# Patient Record
Sex: Female | Born: 1975
Health system: Southern US, Community
[De-identification: ages and names within clinical notes are randomized; demographics above are authoritative.]

## PROBLEM LIST (undated history)

## (undated) DIAGNOSIS — K5900 Constipation, unspecified: Secondary | ICD-10-CM

## (undated) DIAGNOSIS — R921 Mammographic calcification found on diagnostic imaging of breast: Secondary | ICD-10-CM

## (undated) HISTORY — PX: NO PAST SURGERIES: SHX2092

## (undated) HISTORY — DX: Constipation, unspecified: K59.00

---

## 1999-01-22 ENCOUNTER — Other Ambulatory Visit: Admission: RE | Admit: 1999-01-22 | Discharge: 1999-01-22 | Payer: Self-pay | Admitting: Obstetrics and Gynecology

## 1999-10-11 ENCOUNTER — Emergency Department (HOSPITAL_COMMUNITY): Admission: EM | Admit: 1999-10-11 | Discharge: 1999-10-11 | Payer: Self-pay | Admitting: Emergency Medicine

## 1999-10-12 ENCOUNTER — Emergency Department (HOSPITAL_COMMUNITY): Admission: EM | Admit: 1999-10-12 | Discharge: 1999-10-12 | Payer: Self-pay | Admitting: Emergency Medicine

## 1999-11-15 ENCOUNTER — Other Ambulatory Visit: Admission: RE | Admit: 1999-11-15 | Discharge: 1999-11-15 | Payer: Self-pay | Admitting: Obstetrics and Gynecology

## 2010-06-16 ENCOUNTER — Encounter
Admission: RE | Admit: 2010-06-16 | Discharge: 2010-06-16 | Payer: Self-pay | Source: Home / Self Care | Attending: Obstetrics and Gynecology | Admitting: Obstetrics and Gynecology

## 2010-07-07 ENCOUNTER — Other Ambulatory Visit: Payer: Self-pay | Admitting: Obstetrics and Gynecology

## 2011-01-20 ENCOUNTER — Other Ambulatory Visit: Payer: Self-pay | Admitting: Obstetrics and Gynecology

## 2011-01-20 DIAGNOSIS — R921 Mammographic calcification found on diagnostic imaging of breast: Secondary | ICD-10-CM

## 2011-02-08 ENCOUNTER — Ambulatory Visit
Admission: RE | Admit: 2011-02-08 | Discharge: 2011-02-08 | Disposition: A | Discharge status: Home / Self Care | Payer: BC Managed Care – PPO | Source: Ambulatory Visit | Attending: Obstetrics and Gynecology | Admitting: Obstetrics and Gynecology

## 2011-02-08 DIAGNOSIS — R921 Mammographic calcification found on diagnostic imaging of breast: Secondary | ICD-10-CM

## 2011-02-08 LAB — HM MAMMOGRAPHY: HM Mammogram: NORMAL

## 2011-08-11 ENCOUNTER — Ambulatory Visit: Payer: BC Managed Care – PPO | Admitting: Internal Medicine

## 2011-08-12 ENCOUNTER — Ambulatory Visit (INDEPENDENT_AMBULATORY_CARE_PROVIDER_SITE_OTHER): Payer: BC Managed Care – PPO | Admitting: Internal Medicine

## 2011-08-12 ENCOUNTER — Other Ambulatory Visit (INDEPENDENT_AMBULATORY_CARE_PROVIDER_SITE_OTHER): Payer: BC Managed Care – PPO

## 2011-08-12 ENCOUNTER — Encounter: Payer: Self-pay | Admitting: Internal Medicine

## 2011-08-12 VITALS — BP 132/80 | HR 81 | Temp 98.7°F | Resp 16 | Ht 64.0 in | Wt 150.0 lb

## 2011-08-12 DIAGNOSIS — R0609 Other forms of dyspnea: Secondary | ICD-10-CM

## 2011-08-12 DIAGNOSIS — Z0000 Encounter for general adult medical examination without abnormal findings: Secondary | ICD-10-CM | POA: Insufficient documentation

## 2011-08-12 DIAGNOSIS — Z23 Encounter for immunization: Secondary | ICD-10-CM

## 2011-08-12 DIAGNOSIS — Z720 Tobacco use: Secondary | ICD-10-CM | POA: Insufficient documentation

## 2011-08-12 DIAGNOSIS — F17200 Nicotine dependence, unspecified, uncomplicated: Secondary | ICD-10-CM

## 2011-08-12 DIAGNOSIS — R0683 Snoring: Secondary | ICD-10-CM | POA: Insufficient documentation

## 2011-08-12 LAB — COMPREHENSIVE METABOLIC PANEL
AST: 20 U/L (ref 0–37)
Albumin: 3.4 g/dL — ABNORMAL LOW (ref 3.5–5.2)
BUN: 13 mg/dL (ref 6–23)
Calcium: 9.2 mg/dL (ref 8.4–10.5)
Chloride: 107 mEq/L (ref 96–112)
Glucose, Bld: 86 mg/dL (ref 70–99)
Potassium: 5.3 mEq/L — ABNORMAL HIGH (ref 3.5–5.1)

## 2011-08-12 LAB — CBC WITH DIFFERENTIAL/PLATELET
Basophils Absolute: 0.1 10*3/uL (ref 0.0–0.1)
Hemoglobin: 13.1 g/dL (ref 12.0–15.0)
Lymphocytes Relative: 29 % (ref 12.0–46.0)
Monocytes Relative: 9.6 % (ref 3.0–12.0)
Platelets: 313 10*3/uL (ref 150.0–400.0)
RDW: 14.8 % — ABNORMAL HIGH (ref 11.5–14.6)

## 2011-08-12 LAB — URINALYSIS, ROUTINE W REFLEX MICROSCOPIC
Leukocytes, UA: NEGATIVE
Nitrite: NEGATIVE
Specific Gravity, Urine: 1.02 (ref 1.000–1.030)
pH: 7 (ref 5.0–8.0)

## 2011-08-12 LAB — LDL CHOLESTEROL, DIRECT: Direct LDL: 88.6 mg/dL

## 2011-08-12 LAB — TSH: TSH: 0.42 u[IU]/mL (ref 0.35–5.50)

## 2011-08-12 NOTE — Patient Instructions (Signed)
Smoking Cessation This document explains the best ways for you to quit smoking and new treatments to help. It lists new medicines that can double or triple your chances of quitting and quitting for good. It also considers ways to avoid relapses and concerns you may have about quitting, including weight gain. NICOTINE: A POWERFUL ADDICTION If you have tried to quit smoking, you know how hard it can be. It is hard because nicotine is a very addictive drug. For some people, it can be as addictive as heroin or cocaine. Usually, people make 2 or 3 tries, or more, before finally being able to quit. Each time you try to quit, you can learn about what helps and what hurts. Quitting takes hard work and a lot of effort, but you can quit smoking. QUITTING SMOKING IS ONE OF THE MOST IMPORTANT THINGS YOU WILL EVER DO.  You will live longer, feel better, and live better.   The impact on your body of quitting smoking is felt almost immediately:   Within 20 minutes, blood pressure decreases. Pulse returns to its normal level.   After 8 hours, carbon monoxide levels in the blood return to normal. Oxygen level increases.   After 24 hours, chance of heart attack starts to decrease. Breath, hair, and body stop smelling like smoke.   After 48 hours, damaged nerve endings begin to recover. Sense of taste and smell improve.   After 72 hours, the body is virtually free of nicotine. Bronchial tubes relax and breathing becomes easier.   After 2 to 12 weeks, lungs can hold more air. Exercise becomes easier and circulation improves.   Quitting will reduce your risk of having a heart attack, stroke, cancer, or lung disease:   After 1 year, the risk of coronary heart disease is cut in half.   After 5 years, the risk of stroke falls to the same as a nonsmoker.   After 10 years, the risk of lung cancer is cut in half and the risk of other cancers decreases significantly.   After 15 years, the risk of coronary heart  disease drops, usually to the level of a nonsmoker.   If you are pregnant, quitting smoking will improve your chances of having a healthy baby.   The people you live with, especially your children, will be healthier.   You will have extra money to spend on things other than cigarettes.  FIVE KEYS TO QUITTING Studies have shown that these 5 steps will help you quit smoking and quit for good. You have the best chances of quitting if you use them together: 1. Get ready.  2. Get support and encouragement.  3. Learn new skills and behaviors.  4. Get medicine to reduce your nicotine addiction and use it correctly.  5. Be prepared for relapse or difficult situations. Be determined to continue trying to quit, even if you do not succeed at first.  1. GET READY  Set a quit date.   Change your environment.   Get rid of ALL cigarettes, ashtrays, matches, and lighters in your home, car, and place of work.   Do not let people smoke in your home.   Review your past attempts to quit. Think about what worked and what did not.   Once you quit, do not smoke. NOT EVEN A PUFF!  2. GET SUPPORT AND ENCOURAGEMENT Studies have shown that you have a better chance of being successful if you have help. You can get support in many ways.  Tell   your family, friends, and coworkers that you are going to quit and need their support. Ask them not to smoke around you.   Talk to your caregivers (doctor, dentist, nurse, pharmacist, psychologist, and/or smoking counselor).   Get individual, group, or telephone counseling and support. The more counseling you have, the better your chances are of quitting. Programs are available at local hospitals and health centers. Call your local health department for information about programs in your area.   Spiritual beliefs and practices may help some smokers quit.   Quit meters are small computer programs online or downloadable that keep track of quit statistics, such as amount  of "quit-time," cigarettes not smoked, and money saved.   Many smokers find one or more of the many self-help books available useful in helping them quit and stay off tobacco.  3. LEARN NEW SKILLS AND BEHAVIORS  Try to distract yourself from urges to smoke. Talk to someone, go for a walk, or occupy your time with a task.   When you first try to quit, change your routine. Take a different route to work. Drink tea instead of coffee. Eat breakfast in a different place.   Do something to reduce your stress. Take a hot bath, exercise, or read a book.   Plan something enjoyable to do every day. Reward yourself for not smoking.   Explore interactive web-based programs that specialize in helping you quit.  4. GET MEDICINE AND USE IT CORRECTLY Medicines can help you stop smoking and decrease the urge to smoke. Combining medicine with the above behavioral methods and support can quadruple your chances of successfully quitting smoking. The [place] Food and Drug Administration (FDA) has approved 7 medicines to help you quit smoking. These medicines fall into 3 categories.  Nicotine replacement therapy (delivers nicotine to your body without the negative effects and risks of smoking):   Nicotine gum: Available over-the-counter.   Nicotine lozenges: Available over-the-counter.   Nicotine inhaler: Available by prescription.   Nicotine nasal spray: Available by prescription.   Nicotine skin patches (transdermal): Available by prescription and over-the-counter.   Antidepressant medicine (helps people abstain from smoking, but how this works is unknown):   Bupropion sustained-release (SR) tablets: Available by prescription.   Nicotinic receptor partial agonist (simulates the effect of nicotine in your brain):   Varenicline tartrate tablets: Available by prescription.   Ask your caregiver for advice about which medicines to use and how to use them. Carefully read the information on the package.    Everyone who is trying to quit may benefit from using a medicine. If you are pregnant or trying to become pregnant, nursing an infant, you are under age 18, or you smoke fewer than 10 cigarettes per day, talk to your caregiver before taking any nicotine replacement medicines.   You should stop using a nicotine replacement product and call your caregiver if you experience nausea, dizziness, weakness, vomiting, fast or irregular heartbeat, mouth problems with the lozenge or gum, or redness or swelling of the skin around the patch that does not go away.   Do not use any other product containing nicotine while using a nicotine replacement product.   Talk to your caregiver before using these products if you have diabetes, heart disease, asthma, stomach ulcers, you had a recent heart attack, you have high blood pressure that is not controlled with medicine, a history of irregular heartbeat, or you have been prescribed medicine to help you quit smoking.  5. BE PREPARED FOR RELAPSE OR   DIFFICULT SITUATIONS  Most relapses occur within the first 3 months after quitting. Do not be discouraged if you start smoking again. Remember, most people try several times before they finally quit.   You may have symptoms of withdrawal because your body is used to nicotine. You may crave cigarettes, be irritable, feel very hungry, cough often, get headaches, or have difficulty concentrating.   The withdrawal symptoms are only temporary. They are strongest when you first quit, but they will go away within 10 to 14 days.  Here are some difficult situations to watch for:  Alcohol. Avoid drinking alcohol. Drinking lowers your chances of successfully quitting.   Caffeine. Try to reduce the amount of caffeine you consume. It also lowers your chances of successfully quitting.   Other smokers. Being around smoking can make you want to smoke. Avoid smokers.   Weight gain. Many smokers will gain weight when they quit, usually  less than 10 pounds. Eat a healthy diet and stay active. Do not let weight gain distract you from your main goal, quitting smoking. Some medicines that help you quit smoking may also help delay weight gain. You can always lose the weight gained after you quit.   Bad mood or depression. There are a lot of ways to improve your mood other than smoking.  If you are having problems with any of these situations, talk to your caregiver. SPECIAL SITUATIONS AND CONDITIONS Studies suggest that everyone can quit smoking. Your situation or condition can give you a special reason to quit.  Pregnant women/new mothers: By quitting, you protect your baby's health and your own.   Hospitalized patients: By quitting, you reduce health problems and help healing.   Heart attack patients: By quitting, you reduce your risk of a second heart attack.   Lung, head, and neck cancer patients: By quitting, you reduce your chance of a second cancer.   Parents of children and adolescents: By quitting, you protect your children from illnesses caused by secondhand smoke.  QUESTIONS TO THINK ABOUT Think about the following questions before you try to stop smoking. You may want to talk about your answers with your caregiver.  Why do you want to quit?   If you tried to quit in the past, what helped and what did not?   What will be the most difficult situations for you after you quit? How will you plan to handle them?   Who can help you through the tough times? Your family? Friends? Caregiver?   What pleasures do you get from smoking? What ways can you still get pleasure if you quit?  Here are some questions to ask your caregiver:  How can you help me to be successful at quitting?   What medicine do you think would be best for me and how should I take it?   What should I do if I need more help?   What is smoking withdrawal like? How can I get information on withdrawal?  Quitting takes hard work and a lot of effort,  but you can quit smoking. FOR MORE INFORMATION  Smokefree.gov (http://www.davis-sullivan.com/) provides free, accurate, evidence-based information and professional assistance to help support the immediate and long-term needs of people trying to quit smoking. Document Released: 05/10/2001 Document Revised: 05/05/2011 Document Reviewed: 03/02/2009 [facility] Patient Information 2012 [place], [place].Preventive Care for Adults, Female A healthy lifestyle and preventive care can promote health and wellness. Preventive health guidelines for women include the following key practices.  A routine yearly physical is  a good way to check with your caregiver about your health and preventive screening. It is a chance to share any concerns and updates on your health, and to receive a thorough exam.   Visit your dentist for a routine exam and preventive care every 6 months. Brush your teeth twice a day and floss once a day. Good oral hygiene prevents tooth decay and gum disease.   The frequency of eye exams is based on your age, health, family medical history, use of contact lenses, and other factors. Follow your caregiver's recommendations for frequency of eye exams.   Eat a healthy diet. Foods like vegetables, fruits, whole grains, low-fat dairy products, and lean protein foods contain the nutrients you need without too many calories. Decrease your intake of foods high in solid fats, added sugars, and salt. Eat the right amount of calories for you.Get information about a proper diet from your caregiver, if necessary.   Regular physical exercise is one of the most important things you can do for your health. Most adults should get at least 150 minutes of moderate-intensity exercise (any activity that increases your heart rate and causes you to sweat) each week. In addition, most adults need muscle-strengthening exercises on 2 or more days a week.   Maintain a healthy weight. The body mass index (BMI) is a screening tool  to identify possible weight problems. It provides an estimate of body fat based on height and weight. Your caregiver can help determine your BMI, and can help you achieve or maintain a healthy weight.For adults 20 years and older:   A BMI below 18.5 is considered underweight.   A BMI of 18.5 to 24.9 is normal.   A BMI of 25 to 29.9 is considered overweight.   A BMI of 30 and above is considered obese.   Maintain normal blood lipids and cholesterol levels by exercising and minimizing your intake of saturated fat. Eat a balanced diet with plenty of fruit and vegetables. Blood tests for lipids and cholesterol should begin at age 26 and be repeated every 5 years. If your lipid or cholesterol levels are high, you are over 50, or you are at high risk for heart disease, you may need your cholesterol levels checked more frequently.Ongoing high lipid and cholesterol levels should be treated with medicines if diet and exercise are not effective.   If you smoke, find out from your caregiver how to quit. If you do not use tobacco, do not start.   If you are pregnant, do not drink alcohol. If you are breastfeeding, be very cautious about drinking alcohol. If you are not pregnant and choose to drink alcohol, do not exceed 1 drink per day. One drink is considered to be 12 ounces (355 mL) of beer, 5 ounces (148 mL) of wine, or 1.5 ounces (44 mL) of liquor.   Avoid use of street drugs. Do not share needles with anyone. Ask for help if you need support or instructions about stopping the use of drugs.   High blood pressure causes heart disease and increases the risk of stroke. Your blood pressure should be checked at least every 1 to 2 years. Ongoing high blood pressure should be treated with medicines if weight loss and exercise are not effective.   If you are 54 to 37 years old, ask your caregiver if you should take aspirin to prevent strokes.   Diabetes screening involves taking a blood sample to check your  fasting blood sugar level. This should  be done once every 3 years, after age 83, if you are within normal weight and without risk factors for diabetes. Testing should be considered at a younger age or be carried out more frequently if you are overweight and have at least 1 risk factor for diabetes.   Breast cancer screening is essential preventive care for women. You should practice "breast self-awareness." This means understanding the normal appearance and feel of your breasts and may include breast self-examination. Any changes detected, no matter how small, should be reported to a caregiver. Women in their 48s and 30s should have a clinical breast exam (CBE) by a caregiver as part of a regular health exam every 1 to 3 years. After age 48, women should have a CBE every year. Starting at age 24, women should consider having a mammography (breast X-ray test) every year. Women who have a family history of breast cancer should talk to their caregiver about genetic screening. Women at a high risk of breast cancer should talk to their caregivers about having magnetic resonance imaging (MRI) and a mammography every year.   The Pap test is a screening test for cervical cancer. A Pap test can show cell changes on the cervix that might become cervical cancer if left untreated. A Pap test is a procedure in which cells are obtained and examined from the lower end of the uterus (cervix).   Women should have a Pap test starting at age 91.   Between ages 63 and 70, Pap tests should be repeated every 2 years.   Beginning at age 19, you should have a Pap test every 3 years as long as the past 3 Pap tests have been normal.   Some women have medical problems that increase the chance of getting cervical cancer. Talk to your caregiver about these problems. It is especially important to talk to your caregiver if a new problem develops soon after your last Pap test. In these cases, your caregiver may recommend more frequent  screening and Pap tests.   The above recommendations are the same for women who have or have not gotten the vaccine for human papillomavirus (HPV).   If you had a hysterectomy for a problem that was not cancer or a condition that could lead to cancer, then you no longer need Pap tests. Even if you no longer need a Pap test, a regular exam is a good idea to make sure no other problems are starting.   If you are between ages 105 and 17, and you have had normal Pap tests going back 10 years, you no longer need Pap tests. Even if you no longer need a Pap test, a regular exam is a good idea to make sure no other problems are starting.   If you have had past treatment for cervical cancer or a condition that could lead to cancer, you need Pap tests and screening for cancer for at least 20 years after your treatment.   If Pap tests have been discontinued, risk factors (such as a new sexual partner) need to be reassessed to determine if screening should be resumed.   The HPV test is an additional test that may be used for cervical cancer screening. The HPV test looks for the virus that can cause the cell changes on the cervix. The cells collected during the Pap test can be tested for HPV. The HPV test could be used to screen women aged 83 years and older, and should be used in women  of any age who have unclear Pap test results. After the age of 34, women should have HPV testing at the same frequency as a Pap test.   Colorectal cancer can be detected and often prevented. Most routine colorectal cancer screening begins at the age of 41 and continues through age 68. However, your caregiver may recommend screening at an earlier age if you have risk factors for colon cancer. On a yearly basis, your caregiver may provide home test kits to check for hidden blood in the stool. Use of a small camera at the end of a tube, to directly examine the colon (sigmoidoscopy or colonoscopy), can detect the earliest forms of  colorectal cancer. Talk to your caregiver about this at age 75, when routine screening begins. Direct examination of the colon should be repeated every 5 to 10 years through age 71, unless early forms of pre-cancerous polyps or small growths are found.   Hepatitis C blood testing is recommended for all people born from [birth year] through 1966 and any individual with known risks for hepatitis C.   Practice safe sex. Use condoms and avoid high-risk sexual practices to reduce the spread of sexually transmitted infections (STIs). STIs include gonorrhea, chlamydia, syphilis, trichomonas, herpes, HPV, and human immunodeficiency virus (HIV). Herpes, HIV, and HPV are viral illnesses that have no cure. They can result in disability, cancer, and death. Sexually active women aged 88 and younger should be checked for chlamydia. Older women with new or multiple partners should also be tested for chlamydia. Testing for other STIs is recommended if you are sexually active and at increased risk.   Osteoporosis is a disease in which the bones lose minerals and strength with aging. This can result in serious bone fractures. The risk of osteoporosis can be identified using a bone density scan. Women ages 32 and over and women at risk for fractures or osteoporosis should discuss screening with their caregivers. Ask your caregiver whether you should take a calcium supplement or vitamin D to reduce the rate of osteoporosis.   Menopause can be associated with physical symptoms and risks. Hormone replacement therapy is available to decrease symptoms and risks. You should talk to your caregiver about whether hormone replacement therapy is right for you.   Use sunscreen with sun protection factor (SPF) of 30 or more. Apply sunscreen liberally and repeatedly throughout the day. You should seek shade when your shadow is shorter than you. Protect yourself by wearing long sleeves, pants, a wide-brimmed hat, and sunglasses year round,  whenever you are outdoors.   Once a month, do a whole body skin exam, using a mirror to look at the skin on your back. Notify your caregiver of new moles, moles that have irregular borders, moles that are larger than a pencil eraser, or moles that have changed in shape or color.   Stay current with required immunizations.   Influenza. You need a dose every fall (or winter). The composition of the flu vaccine changes each year, so being vaccinated once is not enough.   Pneumococcal polysaccharide. You need 1 to 2 doses if you smoke cigarettes or if you have certain chronic medical conditions. You need 1 dose at age 44 (or older) if you have never been vaccinated.   Tetanus, diphtheria, pertussis (Tdap, Td). Get 1 dose of Tdap vaccine if you are younger than age 96, are over 64 and have contact with an infant, are a Research scientist (physical sciences), are pregnant, or simply want to be protected from whooping  cough. After that, you need a Td booster dose every 10 years. Consult your caregiver if you have not had at least 3 tetanus and diphtheria-containing shots sometime in your life or have a deep or dirty wound.   HPV. You need this vaccine if you are a woman age 9 or younger. The vaccine is given in 3 doses over 6 months.   Measles, mumps, rubella (MMR). You need at least 1 dose of MMR if you were born in 1957 or later. You may also need a second dose.   Meningococcal. If you are age 43 to 37 and a first-year college student living in a residence hall, or have one of several medical conditions, you need to get vaccinated against meningococcal disease. You may also need additional booster doses.   Zoster (shingles). If you are age 7 or older, you should get this vaccine.   Varicella (chickenpox). If you have never had chickenpox or you were vaccinated but received only 1 dose, talk to your caregiver to find out if you need this vaccine.   Hepatitis A. You need this vaccine if you have a specific risk factor  for hepatitis A virus infection or you simply wish to be protected from this disease. The vaccine is usually given as 2 doses, 6 to 18 months apart.   Hepatitis B. You need this vaccine if you have a specific risk factor for hepatitis B virus infection or you simply wish to be protected from this disease. The vaccine is given in 3 doses, usually over 6 months.  Preventive Services / Frequency Ages 86 to 55  Blood pressure check.** / Every 1 to 2 years.   Lipid and cholesterol check.** / Every 5 years beginning at age 23.   Clinical breast exam.** / Every 3 years for women in their 15s and 30s.   Pap test.** / Every 2 years from ages 72 through 92. Every 3 years starting at age 65 through age 54 or 25 with a history of 3 consecutive normal Pap tests.   HPV screening.** / Every 3 years from ages 83 through ages 33 to 49 with a history of 3 consecutive normal Pap tests.   Hepatitis C blood test.** / For any individual with known risks for hepatitis C.   Skin self-exam. / Monthly.   Influenza immunization.** / Every year.   Pneumococcal polysaccharide immunization.** / 1 to 2 doses if you smoke cigarettes or if you have certain chronic medical conditions.   Tetanus, diphtheria, pertussis (Tdap, Td) immunization. / A one-time dose of Tdap vaccine. After that, you need a Td booster dose every 10 years.   HPV immunization. / 3 doses over 6 months, if you are 56 and younger.   Measles, mumps, rubella (MMR) immunization. / You need at least 1 dose of MMR if you were born in 1957 or later. You may also need a second dose.   Meningococcal immunization. / 1 dose if you are age 54 to 71 and a first-year college student living in a residence hall, or have one of several medical conditions, you need to get vaccinated against meningococcal disease. You may also need additional booster doses.   Varicella immunization.** / Consult your caregiver.   Hepatitis A immunization.** / Consult your  caregiver. 2 doses, 6 to 18 months apart.   Hepatitis B immunization.** / Consult your caregiver. 3 doses usually over 6 months.  Ages 24 to 70  Blood pressure check.** / Every 1 to 2 years.  Lipid and cholesterol check.** / Every 5 years beginning at age 61.   Clinical breast exam.** / Every year after age 72.   Mammogram.** / Every year beginning at age 34 and continuing for as long as you are in good health. Consult with your caregiver.   Pap test.** / Every 3 years starting at age 90 through age 43 or 74 with a history of 3 consecutive normal Pap tests.   HPV screening.** / Every 3 years from ages 64 through ages 39 to 25 with a history of 3 consecutive normal Pap tests.   Fecal occult blood test (FOBT) of stool. / Every year beginning at age 64 and continuing until age 48. You may not need to do this test if you get a colonoscopy every 10 years.   Flexible sigmoidoscopy or colonoscopy.** / Every 5 years for a flexible sigmoidoscopy or every 10 years for a colonoscopy beginning at age 62 and continuing until age 76.   Hepatitis C blood test.** / For all people born from [birth year] through 1966 and any individual with known risks for hepatitis C.   Skin self-exam. / Monthly.   Influenza immunization.** / Every year.   Pneumococcal polysaccharide immunization.** / 1 to 2 doses if you smoke cigarettes or if you have certain chronic medical conditions.   Tetanus, diphtheria, pertussis (Tdap, Td) immunization.** / A one-time dose of Tdap vaccine. After that, you need a Td booster dose every 10 years.   Measles, mumps, rubella (MMR) immunization. / You need at least 1 dose of MMR if you were born in 1957 or later. You may also need a second dose.   Varicella immunization.** / Consult your caregiver.   Meningococcal immunization.** / Consult your caregiver.   Hepatitis A immunization.** / Consult your caregiver. 2 doses, 6 to 18 months apart.   Hepatitis B immunization.** / Consult  your caregiver. 3 doses, usually over 6 months.  Ages 2 and over  Blood pressure check.** / Every 1 to 2 years.   Lipid and cholesterol check.** / Every 5 years beginning at age 69.   Clinical breast exam.** / Every year after age 6.   Mammogram.** / Every year beginning at age 62 and continuing for as long as you are in good health. Consult with your caregiver.   Pap test.** / Every 3 years starting at age 5 through age 39 or 83 with a 3 consecutive normal Pap tests. Testing can be stopped between 65 and 70 with 3 consecutive normal Pap tests and no abnormal Pap or HPV tests in the past 10 years.   HPV screening.** / Every 3 years from ages 56 through ages 42 or 69 with a history of 3 consecutive normal Pap tests. Testing can be stopped between 65 and 70 with 3 consecutive normal Pap tests and no abnormal Pap or HPV tests in the past 10 years.   Fecal occult blood test (FOBT) of stool. / Every year beginning at age 95 and continuing until age 12. You may not need to do this test if you get a colonoscopy every 10 years.   Flexible sigmoidoscopy or colonoscopy.** / Every 5 years for a flexible sigmoidoscopy or every 10 years for a colonoscopy beginning at age 82 and continuing until age 64.   Hepatitis C blood test.** / For all people born from [birth year] through 1966 and any individual with known risks for hepatitis C.   Osteoporosis screening.** / A one-time screening for women ages 65 and  over and women at risk for fractures or osteoporosis.   Skin self-exam. / Monthly.   Influenza immunization.** / Every year.   Pneumococcal polysaccharide immunization.** / 1 dose at age 72 (or older) if you have never been vaccinated.   Tetanus, diphtheria, pertussis (Tdap, Td) immunization. / A one-time dose of Tdap vaccine if you are over 65 and have contact with an infant, are a Research scientist (physical sciences), or simply want to be protected from whooping cough. After that, you need a Td booster dose every 10  years.   Varicella immunization.** / Consult your caregiver.   Meningococcal immunization.** / Consult your caregiver.   Hepatitis A immunization.** / Consult your caregiver. 2 doses, 6 to 18 months apart.   Hepatitis B immunization.** / Check with your caregiver. 3 doses, usually over 6 months.  ** Family history and personal history of risk and conditions may change your caregiver's recommendations. Document Released: 07/12/2001 Document Revised: 05/05/2011 Document Reviewed: 10/11/2010 [facility] Patient Information 2012 [place], [place].

## 2011-08-14 ENCOUNTER — Encounter: Payer: Self-pay | Admitting: Internal Medicine

## 2011-08-14 NOTE — Progress Notes (Signed)
  Subjective:    Patient ID: Stephanie Mendoza, female    DOB: Jul 21, 1975, 36 y.o.   MRN: [id number]  HPI New to me she complains of heavy snoring, scalp cramps at night, and frequent awakenings during the night.  Review of Systems  Constitutional: Positive for unexpected weight change (weight gain). Negative for fever, chills, diaphoresis, activity change, appetite change and fatigue.  HENT: Negative.   Eyes: Negative.   Respiratory: Positive for apnea. Negative for cough, choking, chest tightness, shortness of breath, wheezing and stridor.   Cardiovascular: Negative for chest pain, palpitations and leg swelling.  Gastrointestinal: Negative for nausea, abdominal pain, diarrhea, constipation, blood in stool and abdominal distention.  Genitourinary: Negative.   Musculoskeletal: Negative.   Skin: Negative for color change, pallor, rash and wound.  Neurological: Negative for dizziness, tremors, seizures, syncope, facial asymmetry, speech difficulty, weakness, light-headedness, numbness and headaches.  Hematological: Negative for adenopathy. Does not bruise/bleed easily.  Psychiatric/Behavioral: Negative.        Objective:   Physical Exam  Vitals reviewed. Constitutional: She is oriented to person, place, and time. She appears well-developed and well-nourished. No distress.  HENT:  Head: Normocephalic and atraumatic.  Mouth/Throat: Oropharynx is clear and moist. No oropharyngeal exudate.  Eyes: Conjunctivae are normal. Right eye exhibits no discharge. Left eye exhibits no discharge. No scleral icterus.  Neck: Normal range of motion. Neck supple. No JVD present. No tracheal deviation present. No thyromegaly present.  Cardiovascular: Normal rate, regular rhythm, normal heart sounds and intact distal pulses.  Exam reveals no gallop and no friction rub.   No murmur heard. Pulmonary/Chest: Effort normal and breath sounds normal. No stridor. No respiratory distress. She has no wheezes. She has no  rales. She exhibits no tenderness.  Abdominal: Soft. Bowel sounds are normal. She exhibits no distension and no mass. There is no tenderness. There is no rebound and no guarding.  Musculoskeletal: Normal range of motion. She exhibits no edema and no tenderness.  Lymphadenopathy:    She has no cervical adenopathy.  Neurological: She is oriented to person, place, and time.  Skin: Skin is warm and dry. No rash noted. She is not diaphoretic. No erythema. No pallor.  Psychiatric: She has a normal mood and affect. Her behavior is normal. Judgment and thought content normal.          Assessment & Plan:

## 2011-08-14 NOTE — Assessment & Plan Note (Signed)
I will check her labs today to screen for secondary causes and complications and have asked her to have a sleep evaluation done

## 2011-08-14 NOTE — Assessment & Plan Note (Signed)
Exam done, labs ordered, vaccines were updated, pt ed material was given 

## 2011-08-14 NOTE — Assessment & Plan Note (Signed)
She agrees to try to quit smoking, I gave her pt ed material about smoking cessation

## 2011-08-24 ENCOUNTER — Other Ambulatory Visit: Payer: Self-pay | Admitting: Obstetrics and Gynecology

## 2011-08-24 DIAGNOSIS — R921 Mammographic calcification found on diagnostic imaging of breast: Secondary | ICD-10-CM

## 2011-09-02 ENCOUNTER — Institutional Professional Consult (permissible substitution): Payer: BC Managed Care – PPO | Admitting: Pulmonary Disease

## 2011-09-12 ENCOUNTER — Institutional Professional Consult (permissible substitution): Payer: BC Managed Care – PPO | Admitting: Pulmonary Disease

## 2011-09-28 DIAGNOSIS — R921 Mammographic calcification found on diagnostic imaging of breast: Secondary | ICD-10-CM

## 2011-09-28 HISTORY — DX: Mammographic calcification found on diagnostic imaging of breast: R92.1

## 2011-09-29 ENCOUNTER — Other Ambulatory Visit: Payer: Self-pay | Admitting: Obstetrics and Gynecology

## 2011-09-29 ENCOUNTER — Ambulatory Visit
Admission: RE | Admit: 2011-09-29 | Discharge: 2011-09-29 | Disposition: A | Discharge status: Home / Self Care | Payer: BC Managed Care – PPO | Source: Ambulatory Visit | Attending: Obstetrics and Gynecology | Admitting: Obstetrics and Gynecology

## 2011-09-29 DIAGNOSIS — R921 Mammographic calcification found on diagnostic imaging of breast: Secondary | ICD-10-CM

## 2011-10-19 ENCOUNTER — Encounter (INDEPENDENT_AMBULATORY_CARE_PROVIDER_SITE_OTHER): Payer: Self-pay | Admitting: Surgery

## 2011-10-19 ENCOUNTER — Ambulatory Visit (INDEPENDENT_AMBULATORY_CARE_PROVIDER_SITE_OTHER): Payer: BC Managed Care – PPO | Admitting: Surgery

## 2011-10-19 VITALS — BP 112/82 | HR 90 | Temp 97.8°F | Resp 14 | Ht 64.0 in | Wt 147.2 lb

## 2011-10-19 DIAGNOSIS — R928 Other abnormal and inconclusive findings on diagnostic imaging of breast: Secondary | ICD-10-CM

## 2011-10-19 DIAGNOSIS — R921 Mammographic calcification found on diagnostic imaging of breast: Secondary | ICD-10-CM | POA: Insufficient documentation

## 2011-10-19 NOTE — Patient Instructions (Signed)
We will arrange outpatient surgery to remove the small calcifications found on mammogram in your left breast.

## 2011-10-19 NOTE — Progress Notes (Signed)
  CC: Right breast calcifications, moderately suspicious, not amenable to needle core biopsy HPI: This patient has a family history of two cousins who have had breast cancer one at about age 37 he other in her 40s. She recently had a mammogram and some previous calcifications appeared to have been changed and increased slightly and to be slightly pleomorphic. The radiologist did not believe they weren't amenable to a needle core biopsy and recommended excisional biopsy.  The patient has had no breast symptoms or other breast problems.   ROS: Her 12 point review of systems was filled out on our form and is completely negative.  FH: A cousin has developed breast cancer at about age 32. A second cousin developed breast cancer and had bilateral mastectomies eventually dying at age 50 of her disease. No family history of ovarian cancer.  MEDS: Current Outpatient Prescriptions  Medication Sig Dispense Refill  . etonogestrel-ethinyl estradiol (NUVARING) 0.12-0.015 MG/24HR vaginal ring Insert vaginally and leave in place for 3 consecutive weeks, then remove for 1 week.  1 each  12     ALLERGIES:  No Known Allergies   PE VS: BP 112/82  Pulse 90  Temp(Src) 97.8 F (36.6 C) (Temporal)  Resp 14  Ht 5' 4" (1.626 m)  Wt 147 lb 3.2 oz (66.769 kg)  BMI 25.27 kg/m2 GENERAL:  The patient is alert, oriented, and generally healthy-appearing, NAD. Mood and affect are normal.  HEENT:  The head is normocephalic, the eyes nonicteric, the pupils were round regular and equal. EOMs are normal. Pharynx normal. Dentition good.  NECK:  The neck is supple and there are no masses or thyromegaly.  LUNGS: Normal respirations and clear to auscultation.  HEART: Regular rhythm, with no murmurs rubs or gallops. Pulses are intact carotid dorsalis pedis and posterior tibial. No significant varicosities are noted.  BREASTS: Normal ABDOMEN: Soft, flat, and nontender. No masses or organomegaly is noted. No  hernias are noted. Bowel sounds are normal.  EXTREMITIES:  Good range of motion, no edema.   Data Reviewed I have reviewed the patient's chart in Epic as well as her mammogram reports and films.  Assessment Right breast calcifications moderately suspicious, not amenable to core biopsy.  Plan We will schedule her for a wire localized excision of these calcifications under general anesthesia as an outpatient. This will be scheduled to be done at her convenience I have discussed the indications for the lumpectomy and described the procedure. She understand that the chance of removal of the abnormal area is very good, but that occasionally we are unable to locate it and may have to do a second procedure. We also discussed the possibility of a second procedure to get additional tissue. Risks of surgery such as bleeding and infection have also been explained, as well as the implications of not doing the surgery. She understands and wishes to proceed.  

## 2011-10-20 ENCOUNTER — Telehealth (INDEPENDENT_AMBULATORY_CARE_PROVIDER_SITE_OTHER): Payer: Self-pay | Admitting: Surgery

## 2011-10-20 NOTE — Telephone Encounter (Signed)
Letter sent to patient per her request.

## 2011-10-25 ENCOUNTER — Encounter (HOSPITAL_BASED_OUTPATIENT_CLINIC_OR_DEPARTMENT_OTHER): Payer: Self-pay | Admitting: *Deleted

## 2011-10-31 ENCOUNTER — Encounter (HOSPITAL_BASED_OUTPATIENT_CLINIC_OR_DEPARTMENT_OTHER): Payer: Self-pay | Admitting: Certified Registered"

## 2011-10-31 ENCOUNTER — Ambulatory Visit
Admission: RE | Admit: 2011-10-31 | Discharge: 2011-10-31 | Disposition: A | Discharge status: Home / Self Care | Payer: BC Managed Care – PPO | Source: Ambulatory Visit | Attending: Obstetrics and Gynecology | Admitting: Obstetrics and Gynecology

## 2011-10-31 ENCOUNTER — Ambulatory Visit (HOSPITAL_BASED_OUTPATIENT_CLINIC_OR_DEPARTMENT_OTHER): Payer: BC Managed Care – PPO | Admitting: Certified Registered Nurse Anesthetist

## 2011-10-31 ENCOUNTER — Ambulatory Visit (HOSPITAL_BASED_OUTPATIENT_CLINIC_OR_DEPARTMENT_OTHER)
Admission: RE | Admit: 2011-10-31 | Discharge: 2011-10-31 | Disposition: A | Discharge status: Home / Self Care | Payer: BC Managed Care – PPO | Source: Ambulatory Visit | Attending: Surgery | Admitting: Surgery

## 2011-10-31 ENCOUNTER — Encounter (HOSPITAL_BASED_OUTPATIENT_CLINIC_OR_DEPARTMENT_OTHER)
Admission: RE | Disposition: A | Discharge status: Home / Self Care | Payer: Self-pay | Source: Ambulatory Visit | Attending: Surgery

## 2011-10-31 ENCOUNTER — Encounter (HOSPITAL_BASED_OUTPATIENT_CLINIC_OR_DEPARTMENT_OTHER): Payer: Self-pay | Admitting: Certified Registered Nurse Anesthetist

## 2011-10-31 ENCOUNTER — Encounter (HOSPITAL_BASED_OUTPATIENT_CLINIC_OR_DEPARTMENT_OTHER): Payer: Self-pay | Admitting: *Deleted

## 2011-10-31 ENCOUNTER — Ambulatory Visit
Admission: RE | Admit: 2011-10-31 | Discharge: 2011-10-31 | Disposition: A | Discharge status: Home / Self Care | Payer: BC Managed Care – PPO | Source: Ambulatory Visit | Attending: Surgery | Admitting: Surgery

## 2011-10-31 DIAGNOSIS — R921 Mammographic calcification found on diagnostic imaging of breast: Secondary | ICD-10-CM

## 2011-10-31 DIAGNOSIS — N6029 Fibroadenosis of unspecified breast: Secondary | ICD-10-CM | POA: Insufficient documentation

## 2011-10-31 DIAGNOSIS — N6089 Other benign mammary dysplasias of unspecified breast: Secondary | ICD-10-CM

## 2011-10-31 DIAGNOSIS — Z803 Family history of malignant neoplasm of breast: Secondary | ICD-10-CM | POA: Insufficient documentation

## 2011-10-31 DIAGNOSIS — R920 Mammographic microcalcification found on diagnostic imaging of breast: Secondary | ICD-10-CM

## 2011-10-31 HISTORY — PX: BREAST BIOPSY: SHX20

## 2011-10-31 HISTORY — DX: Mammographic calcification found on diagnostic imaging of breast: R92.1

## 2011-10-31 SURGERY — BREAST BIOPSY WITH NEEDLE LOCALIZATION
Anesthesia: General | Site: Breast | Laterality: Right | Wound class: Clean

## 2011-10-31 MED ORDER — ONDANSETRON HCL 4 MG/2ML IJ SOLN: 4.0000 mg | Freq: Once | INTRAMUSCULAR | Status: DC | PRN

## 2011-10-31 MED ORDER — HYDROCODONE-ACETAMINOPHEN 5-325 MG PO TABS: 1.0000 | ORAL_TABLET | ORAL | Status: AC | PRN

## 2011-10-31 MED ORDER — CHLORHEXIDINE GLUCONATE 4 % EX LIQD: 1.0000 "application " | Freq: Once | CUTANEOUS | Status: DC

## 2011-10-31 MED ORDER — OXYCODONE HCL 5 MG PO TABS: 5.0000 mg | ORAL_TABLET | Freq: Once | ORAL | Status: DC | PRN

## 2011-10-31 MED ORDER — ACETAMINOPHEN 10 MG/ML IV SOLN: 1000.0000 mg | Freq: Once | INTRAVENOUS | Status: DC | PRN

## 2011-10-31 MED ORDER — MIDAZOLAM HCL 5 MG/5ML IJ SOLN
INTRAMUSCULAR | Status: DC | PRN
  Administered 2011-10-31: 2 mg via INTRAVENOUS

## 2011-10-31 MED ORDER — LACTATED RINGERS IV SOLN
INTRAVENOUS | Status: DC
Start: 2011-10-31 — End: 2011-10-31
  Administered 2011-10-31 (×2): via INTRAVENOUS

## 2011-10-31 MED ORDER — CEFAZOLIN SODIUM 1-5 GM-% IV SOLN
1.0000 g | INTRAVENOUS | Status: AC
  Administered 2011-10-31: 1 g via INTRAVENOUS

## 2011-10-31 MED ORDER — FENTANYL CITRATE 0.05 MG/ML IJ SOLN
INTRAMUSCULAR | Status: DC | PRN
  Administered 2011-10-31: 100 ug via INTRAVENOUS
  Administered 2011-10-31 (×2): 25 ug via INTRAVENOUS

## 2011-10-31 MED ORDER — BUPIVACAINE HCL (PF) 0.25 % IJ SOLN
INTRAMUSCULAR | Status: DC | PRN
  Administered 2011-10-31: 30 mL

## 2011-10-31 MED ORDER — DEXAMETHASONE SODIUM PHOSPHATE 4 MG/ML IJ SOLN
INTRAMUSCULAR | Status: DC | PRN
Start: 2011-10-31 — End: 2011-10-31
  Administered 2011-10-31: 8 mg via INTRAVENOUS

## 2011-10-31 MED ORDER — HYDROMORPHONE HCL PF 1 MG/ML IJ SOLN: 0.2500 mg | INTRAMUSCULAR | Status: DC | PRN

## 2011-10-31 MED ORDER — PROPOFOL 10 MG/ML IV EMUL
INTRAVENOUS | Status: DC | PRN
  Administered 2011-10-31: 190 mg via INTRAVENOUS

## 2011-10-31 MED ORDER — GLYCOPYRROLATE 0.2 MG/ML IJ SOLN
INTRAMUSCULAR | Status: DC | PRN
  Administered 2011-10-31: 0.1 mg via INTRAVENOUS

## 2011-10-31 MED ORDER — ONDANSETRON HCL 4 MG/2ML IJ SOLN
INTRAMUSCULAR | Status: DC | PRN
  Administered 2011-10-31: 4 mg via INTRAVENOUS

## 2011-10-31 SURGICAL SUPPLY — 48 items
APPLICATOR COTTON TIP 6IN STRL (MISCELLANEOUS) IMPLANT
BINDER BREAST LRG (GAUZE/BANDAGES/DRESSINGS) IMPLANT
BINDER BREAST MEDIUM (GAUZE/BANDAGES/DRESSINGS) ×2 IMPLANT
BINDER BREAST XLRG (GAUZE/BANDAGES/DRESSINGS) IMPLANT
BINDER BREAST XXLRG (GAUZE/BANDAGES/DRESSINGS) IMPLANT
BLADE HEX COATED 2.75 (ELECTRODE) ×2 IMPLANT
BLADE SURG 15 STRL LF DISP TIS (BLADE) ×1 IMPLANT
BLADE SURG 15 STRL SS (BLADE) ×1
CANISTER SUCTION 1200CC (MISCELLANEOUS) ×2 IMPLANT
CHLORAPREP W/TINT 26ML (MISCELLANEOUS) ×2 IMPLANT
CLIP TI MEDIUM 6 (CLIP) IMPLANT
CLIP TI WIDE RED SMALL 6 (CLIP) IMPLANT
CLOTH BEACON ORANGE TIMEOUT ST (SAFETY) ×2 IMPLANT
COVER MAYO STAND STRL (DRAPES) ×2 IMPLANT
COVER TABLE BACK 60X90 (DRAPES) ×2 IMPLANT
DECANTER SPIKE VIAL GLASS SM (MISCELLANEOUS) IMPLANT
DERMABOND ADVANCED (GAUZE/BANDAGES/DRESSINGS) ×1
DERMABOND ADVANCED .7 DNX12 (GAUZE/BANDAGES/DRESSINGS) ×1 IMPLANT
DEVICE DUBIN W/COMP PLATE 8390 (MISCELLANEOUS) ×2 IMPLANT
DRAPE LAPAROTOMY TRNSV 102X78 (DRAPE) ×2 IMPLANT
DRAPE UTILITY XL STRL (DRAPES) ×2 IMPLANT
ELECT REM PT RETURN 9FT ADLT (ELECTROSURGICAL) ×2
ELECTRODE REM PT RTRN 9FT ADLT (ELECTROSURGICAL) ×1 IMPLANT
GLOVE BIO SURGEON STRL SZ7.5 (GLOVE) ×2 IMPLANT
GLOVE ECLIPSE 6.5 STRL STRAW (GLOVE) ×2 IMPLANT
GLOVE EUDERMIC 7 POWDERFREE (GLOVE) ×2 IMPLANT
GOWN PREVENTION PLUS XLARGE (GOWN DISPOSABLE) ×4 IMPLANT
GOWN PREVENTION PLUS XXLARGE (GOWN DISPOSABLE) ×2 IMPLANT
GOWN STRL NON-REIN LRG LVL3 (GOWN DISPOSABLE) ×4 IMPLANT
KIT MARKER MARGIN INK (KITS) ×2 IMPLANT
NEEDLE HYPO 25X1 1.5 SAFETY (NEEDLE) ×2 IMPLANT
NS IRRIG 1000ML POUR BTL (IV SOLUTION) ×2 IMPLANT
PACK BASIN DAY SURGERY FS (CUSTOM PROCEDURE TRAY) ×2 IMPLANT
PENCIL BUTTON HOLSTER BLD 10FT (ELECTRODE) ×2 IMPLANT
SLEEVE SCD COMPRESS KNEE MED (MISCELLANEOUS) ×2 IMPLANT
SPONGE GAUZE 4X4 12PLY (GAUZE/BANDAGES/DRESSINGS) IMPLANT
SPONGE INTESTINAL PEANUT (DISPOSABLE) IMPLANT
SPONGE LAP 4X18 X RAY DECT (DISPOSABLE) ×2 IMPLANT
STAPLER VISISTAT 35W (STAPLE) IMPLANT
SUT MNCRL AB 4-0 PS2 18 (SUTURE) ×2 IMPLANT
SUT SILK 0 TIES 10X30 (SUTURE) IMPLANT
SUT VICRYL 3-0 CR8 SH (SUTURE) ×2 IMPLANT
SYR CONTROL 10ML LL (SYRINGE) ×2 IMPLANT
TOWEL OR 17X24 6PK STRL BLUE (TOWEL DISPOSABLE) ×2 IMPLANT
TOWEL OR NON WOVEN STRL DISP B (DISPOSABLE) ×2 IMPLANT
TUBE CONNECTING 20X1/4 (TUBING) ×2 IMPLANT
WATER STERILE IRR 1000ML POUR (IV SOLUTION) IMPLANT
YANKAUER SUCT BULB TIP NO VENT (SUCTIONS) ×2 IMPLANT

## 2011-10-31 NOTE — Anesthesia Postprocedure Evaluation (Signed)
  Anesthesia Post-op Note  Patient: Stephanie Mendoza  Procedure(s) Performed: Procedure(s) (LRB): BREAST BIOPSY WITH NEEDLE LOCALIZATION (Right)  Patient Location: PACU  Anesthesia Type: General  Level of Consciousness: awake, alert  and oriented  Airway and Oxygen Therapy: Patient Spontanous Breathing  Post-op Pain: mild  Post-op Assessment: Post-op Vital signs reviewed and Patient's Cardiovascular Status Stable  Post-op Vital Signs: stable  Complications: No apparent anesthesia complications

## 2011-10-31 NOTE — Discharge Instructions (Addendum)
[facility] °[phone] ° ° °BREAST BIOPSY/ PARTIAL MASTECTOMY: POST OP INSTRUCTIONS ° °Always review your discharge instruction sheet given to you by the facility where your surgery was performed. ° °IF YOU HAVE DISABILITY OR FAMILY LEAVE FORMS, YOU MUST BRING THEM TO THE OFFICE FOR PROCESSING.  DO NOT GIVE THEM TO YOUR DOCTOR. ° °1. A prescription for pain medication will be given to you upon discharge.  Take your pain medication as prescribed, as needed.  If narcotic pain medicine is not needed, then you may take ibuprofen (Advil) as needed. °2. Take your usually prescribed medications unless otherwise directed °3. If you need a refill on your pain medication, please contact your pharmacy.  They will contact our office to request authorization.  Prescriptions will not be filled after 5pm or on week-ends. °4. You should eat very light the first 24 hours after surgery, such as soup, crackers, pudding, etc.  Resume your normal diet the day after surgery. °5. Most patients will experience some swelling and bruising in the breast.  Ice packs and a good support bra will help.  Swelling and bruising can take several days to resolve.  °6. It is common to experience some constipation if taking pain medication after surgery.  Increasing fluid intake and taking a stool softener will usually help or prevent this problem from occurring.  A mild laxative (Milk of Magnesia or Miralax) should be taken according to package directions if there are no bowel movements after 48 hours. °7. Unless discharge instructions indicate otherwise, you may remove your bandages 24 hours after surgery, and you may shower at that time.  If your surgeon used skin glue on the incision, you may shower in 24 hours.  The glue will flake off over the next 2-3 weeks. °8. DRAINS:  If you have drain, it is important to keep a list of the amount of drainage produced each day in your drains.  Before leaving the hospital, you should  be instructed on drain care.  Call our office if you have any questions about your drains. BE SURE TO BRING THE RECORD OF THE AMOUNT OF DRAINAGE TO YOUR OFFICE VISITS. We use this to determine when the drains can be removed. °9. ACTIVITIES:  You may resume regular daily activities (gradually increasing) beginning the next day.  Wearing a good support bra or sports bra minimizes pain and swelling.  You may have sexual intercourse when it is comfortable. °a. You may drive when you no longer are taking prescription pain medication, you can comfortably wear a seatbelt, and you can safely maneuver your car and apply brakes. °b. RETURN TO WORK:  ______________________________________________________________________________________ °10. You should see your doctor in the office for a follow-up appointment approximately two weeks after your surgery.  Your doctor’s nurse will typically make your follow-up appointment when she calls you with your pathology report.  Expect your pathology report 2-3 business days after your surgery.  You may call to check if you do not hear from us after three days. °11. OTHER INSTRUCTIONS: _______________________________________________________________________________________________ _____________________________________________________________________________________________________________________________________ °_____________________________________________________________________________________________________________________________________ °_____________________________________________________________________________________________________________________________________ ° °WHEN TO CALL YOUR DOCTOR: °1. Fever over 101.0 °2. Nausea and/or vomiting. °3. Extreme swelling or bruising. °4. Continued bleeding from incision. °5. Increased pain, redness, or drainage from the incision. ° °The clinic staff is available to answer your questions during regular business hours.  Please don’t  hesitate to call and ask to speak to one of the nurses for clinical concerns.  If you have a medical emergency, go   to the nearest emergency room or call 911.  A surgeon from [facility] is always on call at the hospital. ° °[address], [address], [place], [place]  [address] ?  °[address] [address], [place], [place]   [address] °                          [phone] ? [phone] ? FAX [phone] °Web site: [email or web address] ° ° ° °Post Anesthesia Home Care Instructions ° °Activity: °Get plenty of rest for the remainder of the day. A responsible adult should stay with you for 24 hours following the procedure.  °For the next 24 hours, DO NOT: °-Drive a car °-Operate machinery °-Drink alcoholic beverages °-Take any medication unless instructed by your physician °-Make any legal decisions or sign important papers. ° °Meals: °Start with liquid foods such as gelatin or soup. Progress to regular foods as tolerated. Avoid greasy, spicy, heavy foods. If nausea and/or vomiting occur, drink only clear liquids until the nausea and/or vomiting subsides. Call your physician if vomiting continues. ° °Special Instructions/Symptoms: °Your throat may feel dry or sore from the anesthesia or the breathing tube placed in your throat during surgery. If this causes discomfort, gargle with warm salt water. The discomfort should disappear within 24 hours. ° ° °

## 2011-10-31 NOTE — Anesthesia Procedure Notes (Signed)
Procedure Name: LMA Insertion Date/Time: 10/31/2011 10:15 AM Performed by: Verlan Friends Pre-anesthesia Checklist: Patient identified, Emergency Drugs available, Suction available, Patient being monitored and Timeout performed Patient Re-evaluated:Patient Re-evaluated prior to inductionOxygen Delivery Method: Circle System Utilized Preoxygenation: Pre-oxygenation with 100% oxygen Intubation Type: IV induction Ventilation: Mask ventilation without difficulty LMA: LMA inserted LMA Size: 4.0 Number of attempts: 1 (atraumatic) Airway Equipment and Method: bite block Placement Confirmation: positive ETCO2 Tube secured with: Tape (pink tape used) Dental Injury: Teeth and Oropharynx as per pre-operative assessment

## 2011-10-31 NOTE — Anesthesia Preprocedure Evaluation (Addendum)
Anesthesia Evaluation  Patient identified by MRN, date of birth, ID band Patient awake    Reviewed: Allergy & Precautions, H&P , NPO status , Patient's Chart, lab work & pertinent test results  Airway Mallampati: I      Dental  (+) Teeth Intact and Dental Advisory Given   Pulmonary  breath sounds clear to auscultation        Cardiovascular Rhythm:Regular Rate:Normal     Neuro/Psych    GI/Hepatic   Endo/Other    Renal/GU      Musculoskeletal   Abdominal   Peds  Hematology   Anesthesia Other Findings   Reproductive/Obstetrics                           Anesthesia Physical Anesthesia Plan  ASA: II  Anesthesia Plan: General   Post-op Pain Management:    Induction: Intravenous  Airway Management Planned: LMA  Additional Equipment:   Intra-op Plan:   Post-operative Plan:   Informed Consent: I have reviewed the patients History and Physical, chart, labs and discussed the procedure including the risks, benefits and alternatives for the proposed anesthesia with the patient or authorized representative who has indicated his/her understanding and acceptance.   Dental advisory given  Plan Discussed with:   Anesthesia Plan Comments: (R. Breast mass H/O smoking  Plan GA with LMA  Kipp Brood, MD )       Anesthesia Quick Evaluation

## 2011-10-31 NOTE — Interval H&P Note (Signed)
History and Physical Interval Note:  10/31/2011 9:56 AM  Stephanie Mendoza  has presented today for surgery, with the diagnosis of Right breast calcifications  The various methods of treatment have been discussed with the patient and family. After consideration of risks, benefits and other options for treatment, the patient has consented to  Procedure(s) (LRB): BREAST BIOPSY WITH NEEDLE LOCALIZATION (Right) as a surgical intervention .  The patients' history has been reviewed, patient examined, no change in status, stable for surgery.  I have reviewed the patients' chart and labs.  Questions were answered to the patient's satisfaction.   Right breast is marked as the operative side.  [name]

## 2011-10-31 NOTE — H&P (View-Only) (Signed)
  CC: Right breast calcifications, moderately suspicious, not amenable to needle core biopsy HPI: This patient has a family history of two cousins who have had breast cancer one at about age 36 he other in her 37s. She recently had a mammogram and some previous calcifications appeared to have been changed and increased slightly and to be slightly pleomorphic. The radiologist did not believe they weren't amenable to a needle core biopsy and recommended excisional biopsy.  The patient has had no breast symptoms or other breast problems.   ROS: Her 12 point review of systems was filled out on our form and is completely negative.  FH: A cousin has developed breast cancer at about age 41. A second cousin developed breast cancer and had bilateral mastectomies eventually dying at age 14 of her disease. No family history of ovarian cancer.  MEDS: Current Outpatient Prescriptions  Medication Sig Dispense Refill  . etonogestrel-ethinyl estradiol (NUVARING) 0.12-0.015 MG/24HR vaginal ring Insert vaginally and leave in place for 3 consecutive weeks, then remove for 1 week.  1 each  12     ALLERGIES:  No Known Allergies   PE VS: BP 112/82  Pulse 90  Temp(Src) 97.8 F (36.6 C) (Temporal)  Resp 14  Ht 5\' 4"  (1.626 m)  Wt 147 lb 3.2 oz (66.769 kg)  BMI 25.27 kg/m2 GENERAL:  The patient is alert, oriented, and generally healthy-appearing, NAD. Mood and affect are normal.  HEENT:  The head is normocephalic, the eyes nonicteric, the pupils were round regular and equal. EOMs are normal. Pharynx normal. Dentition good.  NECK:  The neck is supple and there are no masses or thyromegaly.  LUNGS: Normal respirations and clear to auscultation.  HEART: Regular rhythm, with no murmurs rubs or gallops. Pulses are intact carotid dorsalis pedis and posterior tibial. No significant varicosities are noted.  BREASTS: Normal ABDOMEN: Soft, flat, and nontender. No masses or organomegaly is noted. No  hernias are noted. Bowel sounds are normal.  EXTREMITIES:  Good range of motion, no edema.   Data Reviewed I have reviewed the patient's chart in Epic as well as her mammogram reports and films.  Assessment Right breast calcifications moderately suspicious, not amenable to core biopsy.  Plan We will schedule her for a wire localized excision of these calcifications under general anesthesia as an outpatient. This will be scheduled to be done at her convenience I have discussed the indications for the lumpectomy and described the procedure. She understand that the chance of removal of the abnormal area is very good, but that occasionally we are unable to locate it and may have to do a second procedure. We also discussed the possibility of a second procedure to get additional tissue. Risks of surgery such as bleeding and infection have also been explained, as well as the implications of not doing the surgery. She understands and wishes to proceed.

## 2011-10-31 NOTE — Transfer of Care (Signed)
Immediate Anesthesia Transfer of Care Note  Patient: Stephanie Mendoza  Procedure(s) Performed: Procedure(s) (LRB): BREAST BIOPSY WITH NEEDLE LOCALIZATION (Right)  Patient Location: PACU  Anesthesia Type: General  Level of Consciousness: awake, alert , oriented and patient cooperative  Airway & Oxygen Therapy: Patient Spontanous Breathing and Patient connected to face mask oxygen  Post-op Assessment: Report given to PACU RN and Post -op Vital signs reviewed and stable  Post vital signs: Reviewed and stable  Complications: No apparent anesthesia complications

## 2011-10-31 NOTE — Op Note (Signed)
Stephanie Mendoza  02-Mar-1976  [id number]  10/31/2011   Preoperative diagnosis: Right breast calcifications, lower outer quadrant, moderately suspicious, not amenable to core biopsy  Postoperative diagnosis: Same  Procedure: Wire localized excision of right breast calcifications  Surgeon: Currie Paris, MD, FACS  Anesthesia: General  Clinical History and Indications: this patient presents for a guidewire localized excision of a Cluster of right  breast Calcifications.  Description of procedure: The patient was seen in the holding area and the plans for the procedure reviewed. The Right breast was marked as the operative side. The wire localizing films were reviewed.I also discussed the wire placement with the radiologist. The skin to calcification distance was about 8 cm and the calcifications appear to abut the chest wall  The patient was taken to the operating room and after satisfactory general anesthesia had been obtained the right breast was prepped and draped and the timeout was performed.  The incision was made over the presumed area of the mass. I divided about 2 cm deep into the breast tissue. I then raised the flap laterally and manipulated the guidewire into the wound. I divided some of the breast tissue over the guidewire until I thought it was getting close to the calcifications. I then grasped the tissue on either side the guidewire with Allis clamps and a wide excision down to and including fascia and beyond the tip of the guidewire. There did appear to be fibrocystic changes in this tissue.. Bleeders were controlled with either cautery or sutures as needed. Specimen mammogram showed the calcifications contained in the tissue removed. I confirmed that with the radiologist.  After achieving hemostasis, the incision was closed with 3-0 Vicryl, 4-0 Monocryl subcuticular, and Dermabond.in closing I elevated the breast off of the underlying muscle slightly close the deep layer  and closed in several layers of Vicryl.The patient tolerated the procedure well. There were no operative complications. All counts were correct.   EBL: Minimal  Currie Paris, MD, FACS 10/31/2011 11:01 AM

## 2011-11-01 ENCOUNTER — Telehealth (INDEPENDENT_AMBULATORY_CARE_PROVIDER_SITE_OTHER): Payer: Self-pay | Admitting: General Surgery

## 2011-11-01 NOTE — Telephone Encounter (Signed)
Patient made aware of path results. Will follow up at appt and call with any questions prior.  

## 2011-11-01 NOTE — Telephone Encounter (Signed)
Message copied by Liliana Cline on Tue Nov 01, 2011  5:49 PM ------      Message from: Currie Paris      Created: Tue Nov 01, 2011  5:22 PM       Tell her path is benign and as expected

## 2011-11-03 ENCOUNTER — Encounter (HOSPITAL_BASED_OUTPATIENT_CLINIC_OR_DEPARTMENT_OTHER): Payer: Self-pay | Admitting: Surgery

## 2011-11-11 ENCOUNTER — Encounter (INDEPENDENT_AMBULATORY_CARE_PROVIDER_SITE_OTHER): Payer: BC Managed Care – PPO | Admitting: Surgery

## 2011-12-14 ENCOUNTER — Encounter (INDEPENDENT_AMBULATORY_CARE_PROVIDER_SITE_OTHER): Payer: Self-pay | Admitting: Surgery

## 2011-12-14 ENCOUNTER — Ambulatory Visit (INDEPENDENT_AMBULATORY_CARE_PROVIDER_SITE_OTHER): Payer: BC Managed Care – PPO | Admitting: Surgery

## 2011-12-14 VITALS — BP 116/82 | HR 76 | Temp 97.8°F | Resp 16 | Ht 64.0 in | Wt 149.4 lb

## 2011-12-14 DIAGNOSIS — Z09 Encounter for follow-up examination after completed treatment for conditions other than malignant neoplasm: Secondary | ICD-10-CM

## 2011-12-14 DIAGNOSIS — R928 Other abnormal and inconclusive findings on diagnostic imaging of breast: Secondary | ICD-10-CM

## 2011-12-14 DIAGNOSIS — R921 Mammographic calcification found on diagnostic imaging of breast: Secondary | ICD-10-CM

## 2011-12-14 NOTE — Progress Notes (Signed)
NAME: Stephanie Mendoza                                            DOB: 22-Nov-1975 DATE: 12/14/2011                                                  MRN: [id number]  CC: Post op   HPI: This patient comes in for post op follow-up.Sheunderwent Wire localization excision of right breast calcifications on 10/31/2011. She feels that she is doing well.  PE:  VITAL SIGNS: BP 116/82  Pulse 76  Temp 97.8 F (36.6 C) (Temporal)  Resp 16  Ht 5\' 4"  (1.626 m)  Wt 149 lb 6.4 oz (67.767 kg)  BMI 25.64 kg/m2  General: The patient appears to be healthy, NAD The incision is healed nicely. No evidence of infection or problems.  DATA REVIEWED: Pathology report shows a complex sclerosing lesion with no evidence of cancer. Calcifications were noted on the pathology specimen.  IMPRESSION: The patient is doing well S/P excisional breast biopsy .    PLAN: I will see her back when necessary. I gave her a copy of her pathology report and reviewed it with her.

## 2011-12-14 NOTE — Patient Instructions (Signed)
We will see you again on an as needed basis. Please call the office at [phone] if you have any questions or concerns. Thank you for allowing us to take care of you.  

## 2012-11-14 LAB — HM PAP SMEAR: HM PAP: NORMAL

## 2013-07-15 ENCOUNTER — Encounter: Payer: BC Managed Care – PPO | Admitting: Internal Medicine

## 2013-07-16 ENCOUNTER — Encounter: Payer: BC Managed Care – PPO | Admitting: Internal Medicine

## 2013-08-09 ENCOUNTER — Encounter: Payer: Self-pay | Admitting: Internal Medicine

## 2013-08-09 ENCOUNTER — Ambulatory Visit (INDEPENDENT_AMBULATORY_CARE_PROVIDER_SITE_OTHER): Payer: BC Managed Care – PPO | Admitting: Internal Medicine

## 2013-08-09 ENCOUNTER — Other Ambulatory Visit (INDEPENDENT_AMBULATORY_CARE_PROVIDER_SITE_OTHER): Payer: BC Managed Care – PPO

## 2013-08-09 VITALS — BP 134/76 | HR 84 | Temp 98.5°F | Resp 16 | Ht 64.0 in | Wt 155.2 lb

## 2013-08-09 DIAGNOSIS — Z0000 Encounter for general adult medical examination without abnormal findings: Secondary | ICD-10-CM

## 2013-08-09 DIAGNOSIS — Z23 Encounter for immunization: Secondary | ICD-10-CM

## 2013-08-09 LAB — CBC WITH DIFFERENTIAL/PLATELET
Basophils Absolute: 0 10*3/uL (ref 0.0–0.1)
Basophils Relative: 0.5 % (ref 0.0–3.0)
EOS ABS: 0.1 10*3/uL (ref 0.0–0.7)
EOS PCT: 0.7 % (ref 0.0–5.0)
HCT: 43.3 % (ref 36.0–46.0)
Hemoglobin: 13.8 g/dL (ref 12.0–15.0)
Lymphocytes Relative: 24.2 % (ref 12.0–46.0)
Lymphs Abs: 2.1 10*3/uL (ref 0.7–4.0)
MCHC: 31.9 g/dL (ref 30.0–36.0)
MCV: 72.6 fl — AB (ref 78.0–100.0)
MONO ABS: 0.4 10*3/uL (ref 0.1–1.0)
Monocytes Relative: 4.8 % (ref 3.0–12.0)
NEUTROS PCT: 69.8 % (ref 43.0–77.0)
Neutro Abs: 6.1 10*3/uL (ref 1.4–7.7)
PLATELETS: 326 10*3/uL (ref 150.0–400.0)
RBC: 5.96 Mil/uL — ABNORMAL HIGH (ref 3.87–5.11)
RDW: 14.8 % — ABNORMAL HIGH (ref 11.5–14.6)
WBC: 8.8 10*3/uL (ref 4.5–10.5)

## 2013-08-09 LAB — COMPREHENSIVE METABOLIC PANEL
ALBUMIN: 3.7 g/dL (ref 3.5–5.2)
ALK PHOS: 66 U/L (ref 39–117)
ALT: 15 U/L (ref 0–35)
AST: 17 U/L (ref 0–37)
BUN: 10 mg/dL (ref 6–23)
CHLORIDE: 108 meq/L (ref 96–112)
CO2: 22 meq/L (ref 19–32)
Calcium: 9.1 mg/dL (ref 8.4–10.5)
Creatinine, Ser: 0.8 mg/dL (ref 0.4–1.2)
GFR: 97.14 mL/min (ref >60.00)
GLUCOSE: 93 mg/dL (ref 70–99)
POTASSIUM: 4 meq/L (ref 3.5–5.1)
SODIUM: 138 meq/L (ref 135–145)
TOTAL PROTEIN: 7.3 g/dL (ref 6.0–8.3)
Total Bilirubin: 0.8 mg/dL (ref 0.3–1.2)

## 2013-08-09 LAB — LIPID PANEL
Cholesterol: 212 mg/dL — ABNORMAL HIGH (ref 0–200)
HDL: 88.1 mg/dL (ref >39.00)
LDL Cholesterol: 102 mg/dL — ABNORMAL HIGH (ref 0–99)
Total CHOL/HDL Ratio: 2
Triglycerides: 110 mg/dL (ref 0.0–149.0)
VLDL: 22 mg/dL (ref 0.0–40.0)

## 2013-08-09 LAB — TSH: TSH: 0.57 u[IU]/mL (ref 0.35–5.50)

## 2013-08-09 NOTE — Progress Notes (Signed)
   Subjective:    Patient ID: Stephanie Mendoza, female    DOB: [birth year], 38 y.o.   MRN: [id number]  HPI Comments: She returns for a physical - she tells me that she feels well and offers no complaints.     Review of Systems  Constitutional: Negative.  Negative for fever, chills, diaphoresis, appetite change and fatigue.  HENT: Negative.   Eyes: Negative.   Respiratory: Negative.  Negative for cough, chest tightness, shortness of breath, wheezing and stridor.   Cardiovascular: Negative.  Negative for chest pain, palpitations and leg swelling.  Gastrointestinal: Negative.  Negative for abdominal pain.  Endocrine: Negative.   Genitourinary: Negative.   Musculoskeletal: Negative.  Negative for arthralgias, back pain, myalgias, neck pain and neck stiffness.  Skin: Negative.   Allergic/Immunologic: Negative.   Neurological: Negative for dizziness, tremors, speech difficulty, weakness, light-headedness, numbness and headaches.  Hematological: Negative.  Negative for adenopathy. Does not bruise/bleed easily.  Psychiatric/Behavioral: Negative.        Objective:   Physical Exam  Vitals reviewed. Constitutional: She is oriented to person, place, and time. She appears well-developed and well-nourished. No distress.  HENT:  Head: Normocephalic and atraumatic.  Mouth/Throat: Oropharynx is clear and moist. No oropharyngeal exudate.  Eyes: Conjunctivae and EOM are normal. Pupils are equal, round, and reactive to light. Right eye exhibits no discharge. Left eye exhibits no discharge. No scleral icterus.  Neck: Normal range of motion. Neck supple. No JVD present. No tracheal deviation present. No thyromegaly present.  Cardiovascular: Normal rate, regular rhythm, normal heart sounds and intact distal pulses.  Exam reveals no gallop and no friction rub.   No murmur heard. Pulmonary/Chest: Effort normal and breath sounds normal. No stridor. No respiratory distress. She has no wheezes. She has no  rales. She exhibits no tenderness.  Abdominal: Soft. Bowel sounds are normal. She exhibits no distension and no mass. There is no tenderness. There is no rebound and no guarding.  Musculoskeletal: Normal range of motion. She exhibits no edema and no tenderness.  Lymphadenopathy:    She has no cervical adenopathy.  Neurological: She is alert and oriented to person, place, and time. She has normal reflexes. She displays normal reflexes. No cranial nerve deficit. She exhibits normal muscle tone. Coordination normal.  Skin: Skin is warm and dry. No rash noted. She is not diaphoretic. No erythema. No pallor.  Psychiatric: She has a normal mood and affect. Her behavior is normal. Judgment and thought content normal.     Lab Results  Component Value Date   WBC 8.4 08/12/2011   HGB 14.2 10/31/2011   HCT 41.8 08/12/2011   PLT 313.0 08/12/2011   GLUCOSE 86 08/12/2011   CHOL 205* 08/12/2011   TRIG 169.0* 08/12/2011   HDL 94.10 08/12/2011   LDLDIRECT 88.6 08/12/2011   ALT 17 08/12/2011   AST 20 08/12/2011   NA 139 08/12/2011   K 5.3* 08/12/2011   CL 107 08/12/2011   CREATININE 0.9 08/12/2011   BUN 13 08/12/2011   CO2 26 08/12/2011   TSH 0.42 08/12/2011       Assessment & Plan:

## 2013-08-09 NOTE — Patient Instructions (Signed)

## 2013-08-09 NOTE — Progress Notes (Signed)
Pre visit review using our clinic review tool, if applicable. No additional management support is needed unless otherwise documented below in the visit note. 

## 2013-08-09 NOTE — Assessment & Plan Note (Signed)
Exam done Vaccines were reviewed Labs ordered Pt ed material was given

## 2013-10-15 ENCOUNTER — Emergency Department (INDEPENDENT_AMBULATORY_CARE_PROVIDER_SITE_OTHER)
Admission: EM | Admit: 2013-10-15 | Discharge: 2013-10-15 | Disposition: A | Discharge status: Home / Self Care | Payer: Self-pay | Source: Home / Self Care | Attending: Emergency Medicine | Admitting: Emergency Medicine

## 2013-10-15 ENCOUNTER — Encounter (HOSPITAL_COMMUNITY): Payer: Self-pay | Admitting: Emergency Medicine

## 2013-10-15 DIAGNOSIS — S7011XA Contusion of right thigh, initial encounter: Secondary | ICD-10-CM

## 2013-10-15 DIAGNOSIS — S5010XA Contusion of unspecified forearm, initial encounter: Secondary | ICD-10-CM

## 2013-10-15 DIAGNOSIS — S7012XA Contusion of left thigh, initial encounter: Secondary | ICD-10-CM

## 2013-10-15 DIAGNOSIS — S5012XA Contusion of left forearm, initial encounter: Secondary | ICD-10-CM

## 2013-10-15 DIAGNOSIS — S7010XA Contusion of unspecified thigh, initial encounter: Secondary | ICD-10-CM
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
## 2013-10-17 NOTE — ED Provider Notes (Signed)
Medical screening examination/treatment/procedure(s) were performed by non-physician practitioner and as supervising physician I was immediately available for consultation/collaboration.  Philipp Deputy, M.D.  Harden Mo, MD 10/17/13 [phone]

## 2014-01-02 ENCOUNTER — Other Ambulatory Visit: Payer: Self-pay | Admitting: Obstetrics and Gynecology

## 2014-01-03 LAB — CYTOLOGY - PAP

## 2014-01-09 LAB — HM PAP SMEAR: HM PAP: NORMAL

## 2014-09-11 ENCOUNTER — Ambulatory Visit (INDEPENDENT_AMBULATORY_CARE_PROVIDER_SITE_OTHER): Payer: BLUE CROSS/BLUE SHIELD | Admitting: Internal Medicine

## 2014-09-11 ENCOUNTER — Other Ambulatory Visit (INDEPENDENT_AMBULATORY_CARE_PROVIDER_SITE_OTHER): Payer: BLUE CROSS/BLUE SHIELD

## 2014-09-11 ENCOUNTER — Encounter: Payer: Self-pay | Admitting: Internal Medicine

## 2014-09-11 VITALS — BP 120/78 | HR 77 | Temp 98.7°F | Resp 16 | Ht 64.0 in | Wt 162.0 lb

## 2014-09-11 DIAGNOSIS — Z0000 Encounter for general adult medical examination without abnormal findings: Secondary | ICD-10-CM

## 2014-09-11 LAB — CBC WITH DIFFERENTIAL/PLATELET
BASOS PCT: 0.5 % (ref 0.0–3.0)
Basophils Absolute: 0 10*3/uL (ref 0.0–0.1)
EOS PCT: 2 % (ref 0.0–5.0)
Eosinophils Absolute: 0.2 10*3/uL (ref 0.0–0.7)
HCT: 41.3 % (ref 36.0–46.0)
HEMOGLOBIN: 13.3 g/dL (ref 12.0–15.0)
LYMPHS PCT: 34.8 % (ref 12.0–46.0)
Lymphs Abs: 2.8 10*3/uL (ref 0.7–4.0)
MCHC: 32.3 g/dL (ref 30.0–36.0)
MCV: 71.6 fl — ABNORMAL LOW (ref 78.0–100.0)
Monocytes Absolute: 0.7 10*3/uL (ref 0.1–1.0)
Monocytes Relative: 8.8 % (ref 3.0–12.0)
NEUTROS ABS: 4.4 10*3/uL (ref 1.4–7.7)
NEUTROS PCT: 53.9 % (ref 43.0–77.0)
Platelets: 358 10*3/uL (ref 150.0–400.0)
RBC: 5.76 Mil/uL — AB (ref 3.87–5.11)
RDW: 15.2 % (ref 11.5–15.5)
WBC: 8.1 10*3/uL (ref 4.0–10.5)

## 2014-09-11 LAB — LIPID PANEL
CHOLESTEROL: 205 mg/dL — AB (ref 0–200)
HDL: 86.4 mg/dL (ref >39.00)
LDL CALC: 95 mg/dL (ref 0–99)
NonHDL: 118.6
TRIGLYCERIDES: 120 mg/dL (ref 0.0–149.0)
Total CHOL/HDL Ratio: 2
VLDL: 24 mg/dL (ref 0.0–40.0)

## 2014-09-11 LAB — COMPREHENSIVE METABOLIC PANEL
ALBUMIN: 3.6 g/dL (ref 3.5–5.2)
ALK PHOS: 60 U/L (ref 39–117)
ALT: 10 U/L (ref 0–35)
AST: 13 U/L (ref 0–37)
BUN: 9 mg/dL (ref 6–23)
CO2: 26 mEq/L (ref 19–32)
Calcium: 9.2 mg/dL (ref 8.4–10.5)
Chloride: 106 mEq/L (ref 96–112)
Creatinine, Ser: 0.74 mg/dL (ref 0.40–1.20)
GFR: 111.81 mL/min (ref >60.00)
Glucose, Bld: 84 mg/dL (ref 70–99)
POTASSIUM: 4 meq/L (ref 3.5–5.1)
SODIUM: 136 meq/L (ref 135–145)
TOTAL PROTEIN: 7 g/dL (ref 6.0–8.3)
Total Bilirubin: 0.3 mg/dL (ref 0.2–1.2)

## 2014-09-11 LAB — TSH: TSH: 0.46 u[IU]/mL (ref 0.35–4.50)

## 2014-09-11 NOTE — Progress Notes (Signed)
Pre visit review using our clinic review tool, if applicable. No additional management support is needed unless otherwise documented below in the visit note. 

## 2014-09-11 NOTE — Patient Instructions (Signed)
Preventive Care for Adults A healthy lifestyle and preventive care can promote health and wellness. Preventive health guidelines for women include the following key practices.  A routine yearly physical is a good way to check with your health care provider about your health and preventive screening. It is a chance to share any concerns and updates on your health and to receive a thorough exam.  Visit your dentist for a routine exam and preventive care every 6 months. Brush your teeth twice a day and floss once a day. Good oral hygiene prevents tooth decay and gum disease.  The frequency of eye exams is based on your age, health, family medical history, use of contact lenses, and other factors. Follow your health care provider's recommendations for frequency of eye exams.  Eat a healthy diet. Foods like vegetables, fruits, whole grains, low-fat dairy products, and lean protein foods contain the nutrients you need without too many calories. Decrease your intake of foods high in solid fats, added sugars, and salt. Eat the right amount of calories for you.Get information about a proper diet from your health care provider, if necessary.  Regular physical exercise is one of the most important things you can do for your health. Most adults should get at least 150 minutes of moderate-intensity exercise (any activity that increases your heart rate and causes you to sweat) each week. In addition, most adults need muscle-strengthening exercises on 2 or more days a week.  Maintain a healthy weight. The body mass index (BMI) is a screening tool to identify possible weight problems. It provides an estimate of body fat based on height and weight. Your health care provider can find your BMI and can help you achieve or maintain a healthy weight.For adults 20 years and older:  A BMI below 18.5 is considered underweight.  A BMI of 18.5 to 24.9 is normal.  A BMI of 25 to 29.9 is considered overweight.  A BMI of  30 and above is considered obese.  Maintain normal blood lipids and cholesterol levels by exercising and minimizing your intake of saturated fat. Eat a balanced diet with plenty of fruit and vegetables. Blood tests for lipids and cholesterol should begin at age 76 and be repeated every 5 years. If your lipid or cholesterol levels are high, you are over 50, or you are at high risk for heart disease, you may need your cholesterol levels checked more frequently.Ongoing high lipid and cholesterol levels should be treated with medicines if diet and exercise are not working.  If you smoke, find out from your health care provider how to quit. If you do not use tobacco, do not start.  Lung cancer screening is recommended for adults aged 22-80 years who are at high risk for developing lung cancer because of a history of smoking. A yearly low-dose CT scan of the lungs is recommended for people who have at least a 30-pack-year history of smoking and are a current smoker or have quit within the past 15 years. A pack year of smoking is smoking an average of 1 pack of cigarettes a day for 1 year (for example: 1 pack a day for 30 years or 2 packs a day for 15 years). Yearly screening should continue until the smoker has stopped smoking for at least 15 years. Yearly screening should be stopped for people who develop a health problem that would prevent them from having lung cancer treatment.  If you are pregnant, do not drink alcohol. If you are breastfeeding,  be very cautious about drinking alcohol. If you are not pregnant and choose to drink alcohol, do not have more than 1 drink per day. One drink is considered to be 12 ounces (355 mL) of beer, 5 ounces (148 mL) of wine, or 1.5 ounces (44 mL) of liquor.  Avoid use of street drugs. Do not share needles with anyone. Ask for help if you need support or instructions about stopping the use of drugs.  High blood pressure causes heart disease and increases the risk of  stroke. Your blood pressure should be checked at least every 1 to 2 years. Ongoing high blood pressure should be treated with medicines if weight loss and exercise do not work.  If you are 75-52 years old, ask your health care provider if you should take aspirin to prevent strokes.  Diabetes screening involves taking a blood sample to check your fasting blood sugar level. This should be done once every 3 years, after age 15, if you are within normal weight and without risk factors for diabetes. Testing should be considered at a younger age or be carried out more frequently if you are overweight and have at least 1 risk factor for diabetes.  Breast cancer screening is essential preventive care for women. You should practice "breast self-awareness." This means understanding the normal appearance and feel of your breasts and may include breast self-examination. Any changes detected, no matter how small, should be reported to a health care provider. Women in their 58s and 30s should have a clinical breast exam (CBE) by a health care provider as part of a regular health exam every 1 to 3 years. After age 16, women should have a CBE every year. Starting at age 53, women should consider having a mammogram (breast X-ray test) every year. Women who have a family history of breast cancer should talk to their health care provider about genetic screening. Women at a high risk of breast cancer should talk to their health care providers about having an MRI and a mammogram every year.  Breast cancer gene (BRCA)-related cancer risk assessment is recommended for women who have family members with BRCA-related cancers. BRCA-related cancers include breast, ovarian, tubal, and peritoneal cancers. Having family members with these cancers may be associated with an increased risk for harmful changes (mutations) in the breast cancer genes BRCA1 and BRCA2. Results of the assessment will determine the need for genetic counseling and  BRCA1 and BRCA2 testing.  Routine pelvic exams to screen for cancer are no longer recommended for nonpregnant women who are considered low risk for cancer of the pelvic organs (ovaries, uterus, and vagina) and who do not have symptoms. Ask your health care provider if a screening pelvic exam is right for you.  If you have had past treatment for cervical cancer or a condition that could lead to cancer, you need Pap tests and screening for cancer for at least 20 years after your treatment. If Pap tests have been discontinued, your risk factors (such as having a new sexual partner) need to be reassessed to determine if screening should be resumed. Some women have medical problems that increase the chance of getting cervical cancer. In these cases, your health care provider may recommend more frequent screening and Pap tests.  The HPV test is an additional test that may be used for cervical cancer screening. The HPV test looks for the virus that can cause the cell changes on the cervix. The cells collected during the Pap test can be  tested for HPV. The HPV test could be used to screen women aged 30 years and older, and should be used in women of any age who have unclear Pap test results. After the age of 30, women should have HPV testing at the same frequency as a Pap test.  Colorectal cancer can be detected and often prevented. Most routine colorectal cancer screening begins at the age of 50 years and continues through age 75 years. However, your health care provider may recommend screening at an earlier age if you have risk factors for colon cancer. On a yearly basis, your health care provider may provide home test kits to check for hidden blood in the stool. Use of a small camera at the end of a tube, to directly examine the colon (sigmoidoscopy or colonoscopy), can detect the earliest forms of colorectal cancer. Talk to your health care provider about this at age 50, when routine screening begins. Direct  exam of the colon should be repeated every 5-10 years through age 75 years, unless early forms of pre-cancerous polyps or small growths are found.  People who are at an increased risk for hepatitis B should be screened for this virus. You are considered at high risk for hepatitis B if:  You were born in a country where hepatitis B occurs often. Talk with your health care provider about which countries are considered high risk.  Your parents were born in a high-risk country and you have not received a shot to protect against hepatitis B (hepatitis B vaccine).  You have HIV or AIDS.  You use needles to inject street drugs.  You live with, or have sex with, someone who has hepatitis B.  You get hemodialysis treatment.  You take certain medicines for conditions like cancer, organ transplantation, and autoimmune conditions.  Hepatitis C blood testing is recommended for all people born from 1945 through 1965 and any individual with known risks for hepatitis C.  Practice safe sex. Use condoms and avoid high-risk sexual practices to reduce the spread of sexually transmitted infections (STIs). STIs include gonorrhea, chlamydia, syphilis, trichomonas, herpes, HPV, and human immunodeficiency virus (HIV). Herpes, HIV, and HPV are viral illnesses that have no cure. They can result in disability, cancer, and death.  You should be screened for sexually transmitted illnesses (STIs) including gonorrhea and chlamydia if:  You are sexually active and are younger than 24 years.  You are older than 24 years and your health care provider tells you that you are at risk for this type of infection.  Your sexual activity has changed since you were last screened and you are at an increased risk for chlamydia or gonorrhea. Ask your health care provider if you are at risk.  If you are at risk of being infected with HIV, it is recommended that you take a prescription medicine daily to prevent HIV infection. This is  called preexposure prophylaxis (PrEP). You are considered at risk if:  You are a heterosexual woman, are sexually active, and are at increased risk for HIV infection.  You take drugs by injection.  You are sexually active with a partner who has HIV.  Talk with your health care provider about whether you are at high risk of being infected with HIV. If you choose to begin PrEP, you should first be tested for HIV. You should then be tested every 3 months for as long as you are taking PrEP.  Osteoporosis is a disease in which the bones lose minerals and strength   with aging. This can result in serious bone fractures or breaks. The risk of osteoporosis can be identified using a bone density scan. Women ages 65 years and over and women at risk for fractures or osteoporosis should discuss screening with their health care providers. Ask your health care provider whether you should take a calcium supplement or vitamin D to reduce the rate of osteoporosis.  Menopause can be associated with physical symptoms and risks. Hormone replacement therapy is available to decrease symptoms and risks. You should talk to your health care provider about whether hormone replacement therapy is right for you.  Use sunscreen. Apply sunscreen liberally and repeatedly throughout the day. You should seek shade when your shadow is shorter than you. Protect yourself by wearing long sleeves, pants, a wide-brimmed hat, and sunglasses year round, whenever you are outdoors.  Once a month, do a whole body skin exam, using a mirror to look at the skin on your back. Tell your health care provider of new moles, moles that have irregular borders, moles that are larger than a pencil eraser, or moles that have changed in shape or color.  Stay current with required vaccines (immunizations).  Influenza vaccine. All adults should be immunized every year.  Tetanus, diphtheria, and acellular pertussis (Td, Tdap) vaccine. Pregnant women should  receive 1 dose of Tdap vaccine during each pregnancy. The dose should be obtained regardless of the length of time since the last dose. Immunization is preferred during the 27th-36th week of gestation. An adult who has not previously received Tdap or who does not know her vaccine status should receive 1 dose of Tdap. This initial dose should be followed by tetanus and diphtheria toxoids (Td) booster doses every 10 years. Adults with an unknown or incomplete history of completing a 3-dose immunization series with Td-containing vaccines should begin or complete a primary immunization series including a Tdap dose. Adults should receive a Td booster every 10 years.  Varicella vaccine. An adult without evidence of immunity to varicella should receive 2 doses or a second dose if she has previously received 1 dose. Pregnant females who do not have evidence of immunity should receive the first dose after pregnancy. This first dose should be obtained before leaving the health care facility. The second dose should be obtained 4-8 weeks after the first dose.  Human papillomavirus (HPV) vaccine. Females aged 13-26 years who have not received the vaccine previously should obtain the 3-dose series. The vaccine is not recommended for use in pregnant females. However, pregnancy testing is not needed before receiving a dose. If a female is found to be pregnant after receiving a dose, no treatment is needed. In that case, the remaining doses should be delayed until after the pregnancy. Immunization is recommended for any person with an immunocompromised condition through the age of 26 years if she did not get any or all doses earlier. During the 3-dose series, the second dose should be obtained 4-8 weeks after the first dose. The third dose should be obtained 24 weeks after the first dose and 16 weeks after the second dose.  Zoster vaccine. One dose is recommended for adults aged 60 years or older unless certain conditions are  present.  Measles, mumps, and rubella (MMR) vaccine. Adults born before 1957 generally are considered immune to measles and mumps. Adults born in 1957 or later should have 1 or more doses of MMR vaccine unless there is a contraindication to the vaccine or there is laboratory evidence of immunity to   each of the three diseases. A routine second dose of MMR vaccine should be obtained at least 28 days after the first dose for students attending postsecondary schools, health care workers, or international travelers. People who received inactivated measles vaccine or an unknown type of measles vaccine during 1963-1967 should receive 2 doses of MMR vaccine. People who received inactivated mumps vaccine or an unknown type of mumps vaccine before 1979 and are at high risk for mumps infection should consider immunization with 2 doses of MMR vaccine. For females of childbearing age, rubella immunity should be determined. If there is no evidence of immunity, females who are not pregnant should be vaccinated. If there is no evidence of immunity, females who are pregnant should delay immunization until after pregnancy. Unvaccinated health care workers born before 1957 who lack laboratory evidence of measles, mumps, or rubella immunity or laboratory confirmation of disease should consider measles and mumps immunization with 2 doses of MMR vaccine or rubella immunization with 1 dose of MMR vaccine.  Pneumococcal 13-valent conjugate (PCV13) vaccine. When indicated, a person who is uncertain of her immunization history and has no record of immunization should receive the PCV13 vaccine. An adult aged 19 years or older who has certain medical conditions and has not been previously immunized should receive 1 dose of PCV13 vaccine. This PCV13 should be followed with a dose of pneumococcal polysaccharide (PPSV23) vaccine. The PPSV23 vaccine dose should be obtained at least 8 weeks after the dose of PCV13 vaccine. An adult aged 19  years or older who has certain medical conditions and previously received 1 or more doses of PPSV23 vaccine should receive 1 dose of PCV13. The PCV13 vaccine dose should be obtained 1 or more years after the last PPSV23 vaccine dose.  Pneumococcal polysaccharide (PPSV23) vaccine. When PCV13 is also indicated, PCV13 should be obtained first. All adults aged 65 years and older should be immunized. An adult younger than age 65 years who has certain medical conditions should be immunized. Any person who resides in a nursing home or long-term care facility should be immunized. An adult smoker should be immunized. People with an immunocompromised condition and certain other conditions should receive both PCV13 and PPSV23 vaccines. People with human immunodeficiency virus (HIV) infection should be immunized as soon as possible after diagnosis. Immunization during chemotherapy or radiation therapy should be avoided. Routine use of PPSV23 vaccine is not recommended for American Indians, [place] Natives, or people younger than 65 years unless there are medical conditions that require PPSV23 vaccine. When indicated, people who have unknown immunization and have no record of immunization should receive PPSV23 vaccine. One-time revaccination 5 years after the first dose of PPSV23 is recommended for people aged 19-64 years who have chronic kidney failure, nephrotic syndrome, asplenia, or immunocompromised conditions. People who received 1-2 doses of PPSV23 before age 65 years should receive another dose of PPSV23 vaccine at age 65 years or later if at least 5 years have passed since the previous dose. Doses of PPSV23 are not needed for people immunized with PPSV23 at or after age 65 years.  Meningococcal vaccine. Adults with asplenia or persistent complement component deficiencies should receive 2 doses of quadrivalent meningococcal conjugate (MenACWY-D) vaccine. The doses should be obtained at least 2 months apart.  Microbiologists working with certain meningococcal bacteria, military recruits, people at risk during an outbreak, and people who travel to or live in countries with a high rate of meningitis should be immunized. A first-year college student up through age   21 years who is living in a residence hall should receive a dose if she did not receive a dose on or after her 16th birthday. Adults who have certain high-risk conditions should receive one or more doses of vaccine.  Hepatitis A vaccine. Adults who wish to be protected from this disease, have certain high-risk conditions, work with hepatitis A-infected animals, work in hepatitis A research labs, or travel to or work in countries with a high rate of hepatitis A should be immunized. Adults who were previously unvaccinated and who anticipate close contact with an international adoptee during the first 60 days after arrival in the [place] from a country with a high rate of hepatitis A should be immunized.  Hepatitis B vaccine. Adults who wish to be protected from this disease, have certain high-risk conditions, may be exposed to blood or other infectious body fluids, are household contacts or sex partners of hepatitis B positive people, are clients or workers in certain care facilities, or travel to or work in countries with a high rate of hepatitis B should be immunized.  Haemophilus influenzae type b (Hib) vaccine. A previously unvaccinated person with asplenia or sickle cell disease or having a scheduled splenectomy should receive 1 dose of Hib vaccine. Regardless of previous immunization, a recipient of a hematopoietic stem cell transplant should receive a 3-dose series 6-12 months after her successful transplant. Hib vaccine is not recommended for adults with HIV infection. Preventive Services / Frequency Ages 64 to 68 years  Blood pressure check.** / Every 1 to 2 years.  Lipid and cholesterol check.** / Every 5 years beginning at age  22.  Clinical breast exam.** / Every 3 years for women in their 88s and 53s.  BRCA-related cancer risk assessment.** / For women who have family members with a BRCA-related cancer (breast, ovarian, tubal, or peritoneal cancers).  Pap test.** / Every 2 years from ages 90 through 51. Every 3 years starting at age 21 through age 56 or 3 with a history of 3 consecutive normal Pap tests.  HPV screening.** / Every 3 years from ages 24 through ages 1 to 46 with a history of 3 consecutive normal Pap tests.  Hepatitis C blood test.** / For any individual with known risks for hepatitis C.  Skin self-exam. / Monthly.  Influenza vaccine. / Every year.  Tetanus, diphtheria, and acellular pertussis (Tdap, Td) vaccine.** / Consult your health care provider. Pregnant women should receive 1 dose of Tdap vaccine during each pregnancy. 1 dose of Td every 10 years.  Varicella vaccine.** / Consult your health care provider. Pregnant females who do not have evidence of immunity should receive the first dose after pregnancy.  HPV vaccine. / 3 doses over 6 months, if 72 and younger. The vaccine is not recommended for use in pregnant females. However, pregnancy testing is not needed before receiving a dose.  Measles, mumps, rubella (MMR) vaccine.** / You need at least 1 dose of MMR if you were born in 1957 or later. You may also need a 2nd dose. For females of childbearing age, rubella immunity should be determined. If there is no evidence of immunity, females who are not pregnant should be vaccinated. If there is no evidence of immunity, females who are pregnant should delay immunization until after pregnancy.  Pneumococcal 13-valent conjugate (PCV13) vaccine.** / Consult your health care provider.  Pneumococcal polysaccharide (PPSV23) vaccine.** / 1 to 2 doses if you smoke cigarettes or if you have certain conditions.  Meningococcal vaccine.** /  1 dose if you are age 19 to 21 years and a first-year college  student living in a residence hall, or have one of several medical conditions, you need to get vaccinated against meningococcal disease. You may also need additional booster doses.  Hepatitis A vaccine.** / Consult your health care provider.  Hepatitis B vaccine.** / Consult your health care provider.  Haemophilus influenzae type b (Hib) vaccine.** / Consult your health care provider. Ages 40 to 64 years  Blood pressure check.** / Every 1 to 2 years.  Lipid and cholesterol check.** / Every 5 years beginning at age 20 years.  Lung cancer screening. / Every year if you are aged 55-80 years and have a 30-pack-year history of smoking and currently smoke or have quit within the past 15 years. Yearly screening is stopped once you have quit smoking for at least 15 years or develop a health problem that would prevent you from having lung cancer treatment.  Clinical breast exam.** / Every year after age 40 years.  BRCA-related cancer risk assessment.** / For women who have family members with a BRCA-related cancer (breast, ovarian, tubal, or peritoneal cancers).  Mammogram.** / Every year beginning at age 40 years and continuing for as long as you are in good health. Consult with your health care provider.  Pap test.** / Every 3 years starting at age 30 years through age 65 or 70 years with a history of 3 consecutive normal Pap tests.  HPV screening.** / Every 3 years from ages 30 years through ages 65 to 70 years with a history of 3 consecutive normal Pap tests.  Fecal occult blood test (FOBT) of stool. / Every year beginning at age 50 years and continuing until age 75 years. You may not need to do this test if you get a colonoscopy every 10 years.  Flexible sigmoidoscopy or colonoscopy.** / Every 5 years for a flexible sigmoidoscopy or every 10 years for a colonoscopy beginning at age 50 years and continuing until age 75 years.  Hepatitis C blood test.** / For all people born from 1945 through  1965 and any individual with known risks for hepatitis C.  Skin self-exam. / Monthly.  Influenza vaccine. / Every year.  Tetanus, diphtheria, and acellular pertussis (Tdap/Td) vaccine.** / Consult your health care provider. Pregnant women should receive 1 dose of Tdap vaccine during each pregnancy. 1 dose of Td every 10 years.  Varicella vaccine.** / Consult your health care provider. Pregnant females who do not have evidence of immunity should receive the first dose after pregnancy.  Zoster vaccine.** / 1 dose for adults aged 60 years or older.  Measles, mumps, rubella (MMR) vaccine.** / You need at least 1 dose of MMR if you were born in 1957 or later. You may also need a 2nd dose. For females of childbearing age, rubella immunity should be determined. If there is no evidence of immunity, females who are not pregnant should be vaccinated. If there is no evidence of immunity, females who are pregnant should delay immunization until after pregnancy.  Pneumococcal 13-valent conjugate (PCV13) vaccine.** / Consult your health care provider.  Pneumococcal polysaccharide (PPSV23) vaccine.** / 1 to 2 doses if you smoke cigarettes or if you have certain conditions.  Meningococcal vaccine.** / Consult your health care provider.  Hepatitis A vaccine.** / Consult your health care provider.  Hepatitis B vaccine.** / Consult your health care provider.  Haemophilus influenzae type b (Hib) vaccine.** / Consult your health care provider. Ages 65   years and over  Blood pressure check.** / Every 1 to 2 years.  Lipid and cholesterol check.** / Every 5 years beginning at age 22 years.  Lung cancer screening. / Every year if you are aged 73-80 years and have a 30-pack-year history of smoking and currently smoke or have quit within the past 15 years. Yearly screening is stopped once you have quit smoking for at least 15 years or develop a health problem that would prevent you from having lung cancer  treatment.  Clinical breast exam.** / Every year after age 4 years.  BRCA-related cancer risk assessment.** / For women who have family members with a BRCA-related cancer (breast, ovarian, tubal, or peritoneal cancers).  Mammogram.** / Every year beginning at age 40 years and continuing for as long as you are in good health. Consult with your health care provider.  Pap test.** / Every 3 years starting at age 9 years through age 34 or 91 years with 3 consecutive normal Pap tests. Testing can be stopped between 65 and 70 years with 3 consecutive normal Pap tests and no abnormal Pap or HPV tests in the past 10 years.  HPV screening.** / Every 3 years from ages 57 years through ages 64 or 45 years with a history of 3 consecutive normal Pap tests. Testing can be stopped between 65 and 70 years with 3 consecutive normal Pap tests and no abnormal Pap or HPV tests in the past 10 years.  Fecal occult blood test (FOBT) of stool. / Every year beginning at age 15 years and continuing until age 17 years. You may not need to do this test if you get a colonoscopy every 10 years.  Flexible sigmoidoscopy or colonoscopy.** / Every 5 years for a flexible sigmoidoscopy or every 10 years for a colonoscopy beginning at age 86 years and continuing until age 71 years.  Hepatitis C blood test.** / For all people born from [birth year] through 1966 and any individual with known risks for hepatitis C.  Osteoporosis screening.** / A one-time screening for women ages 83 years and over and women at risk for fractures or osteoporosis.  Skin self-exam. / Monthly.  Influenza vaccine. / Every year.  Tetanus, diphtheria, and acellular pertussis (Tdap/Td) vaccine.** / 1 dose of Td every 10 years.  Varicella vaccine.** / Consult your health care provider.  Zoster vaccine.** / 1 dose for adults aged 61 years or older.  Pneumococcal 13-valent conjugate (PCV13) vaccine.** / Consult your health care provider.  Pneumococcal  polysaccharide (PPSV23) vaccine.** / 1 dose for all adults aged 28 years and older.  Meningococcal vaccine.** / Consult your health care provider.  Hepatitis A vaccine.** / Consult your health care provider.  Hepatitis B vaccine.** / Consult your health care provider.  Haemophilus influenzae type b (Hib) vaccine.** / Consult your health care provider. ** Family history and personal history of risk and conditions may change your health care provider's recommendations. Document Released: 07/12/2001 Document Revised: 09/30/2013 Document Reviewed: 10/11/2010 [facility] Patient Information 2015 [place], [place]. This information is not intended to replace advice given to you by your health care provider. Make sure you discuss any questions you have with your health care provider.

## 2014-09-14 ENCOUNTER — Encounter: Payer: Self-pay | Admitting: Internal Medicine

## 2014-09-14 NOTE — Progress Notes (Signed)
   Subjective:    Patient ID: Stephanie Mendoza, female    DOB: August 21, 1975, 39 y.o.   MRN: [id number]  HPI Comments: She returns for a physical - feels well and offers no complaints.     Review of Systems  All other systems reviewed and are negative.      Objective:   Physical Exam  Constitutional: She is oriented to person, place, and time. She appears well-developed and well-nourished. No distress.  HENT:  Head: Normocephalic and atraumatic.  Mouth/Throat: Oropharynx is clear and moist. No oropharyngeal exudate.  Eyes: Conjunctivae are normal. Right eye exhibits no discharge. Left eye exhibits no discharge. No scleral icterus.  Neck: Normal range of motion. Neck supple. No JVD present. No tracheal deviation present. No thyromegaly present.  Cardiovascular: Normal rate, regular rhythm, normal heart sounds and intact distal pulses.  Exam reveals no gallop and no friction rub.   No murmur heard. Pulmonary/Chest: Effort normal and breath sounds normal. No stridor. No respiratory distress. She has no wheezes. She has no rales. She exhibits no tenderness.  Abdominal: Soft. Bowel sounds are normal. She exhibits no distension and no mass. There is no tenderness. There is no rebound and no guarding.  Musculoskeletal: Normal range of motion. She exhibits no edema or tenderness.  Lymphadenopathy:    She has no cervical adenopathy.  Neurological: She is oriented to person, place, and time.  Skin: Skin is warm and dry. No rash noted. She is not diaphoretic. No erythema. No pallor.  Vitals reviewed.  Lab Results  Component Value Date   WBC 8.1 09/11/2014   HGB 13.3 09/11/2014   HCT 41.3 09/11/2014   PLT 358.0 09/11/2014   GLUCOSE 84 09/11/2014   CHOL 205* 09/11/2014   TRIG 120.0 09/11/2014   HDL 86.40 09/11/2014   LDLDIRECT 88.6 08/12/2011   LDLCALC 95 09/11/2014   ALT 10 09/11/2014   AST 13 09/11/2014   NA 136 09/11/2014   K 4.0 09/11/2014   CL 106 09/11/2014   CREATININE 0.74  09/11/2014   BUN 9 09/11/2014   CO2 26 09/11/2014   TSH 0.46 09/11/2014         Assessment & Plan:

## 2014-09-14 NOTE — Assessment & Plan Note (Signed)
I asked her to quit smoking Exam done Vaccines were reviewed and updated Labs are normal Pt ed material was given

## 2015-01-22 ENCOUNTER — Other Ambulatory Visit: Payer: Self-pay | Admitting: Obstetrics and Gynecology

## 2015-01-23 LAB — CYTOLOGY - PAP

## 2015-03-10 LAB — HM MAMMOGRAPHY

## 2015-09-14 ENCOUNTER — Encounter: Payer: Self-pay | Admitting: Internal Medicine

## 2015-09-14 ENCOUNTER — Other Ambulatory Visit (INDEPENDENT_AMBULATORY_CARE_PROVIDER_SITE_OTHER): Payer: BLUE CROSS/BLUE SHIELD

## 2015-09-14 ENCOUNTER — Ambulatory Visit (INDEPENDENT_AMBULATORY_CARE_PROVIDER_SITE_OTHER): Payer: BLUE CROSS/BLUE SHIELD | Admitting: Internal Medicine

## 2015-09-14 VITALS — BP 122/86 | HR 82 | Temp 98.5°F | Resp 16 | Ht 64.0 in | Wt 170.0 lb

## 2015-09-14 DIAGNOSIS — Z0000 Encounter for general adult medical examination without abnormal findings: Secondary | ICD-10-CM

## 2015-09-14 LAB — CBC WITH DIFFERENTIAL/PLATELET
BASOS ABS: 0.1 10*3/uL (ref 0.0–0.1)
BASOS PCT: 0.7 % (ref 0.0–3.0)
EOS ABS: 0.1 10*3/uL (ref 0.0–0.7)
Eosinophils Relative: 1.4 % (ref 0.0–5.0)
HEMATOCRIT: 42.5 % (ref 36.0–46.0)
HEMOGLOBIN: 13.8 g/dL (ref 12.0–15.0)
LYMPHS PCT: 25.4 % (ref 12.0–46.0)
Lymphs Abs: 2 10*3/uL (ref 0.7–4.0)
MCHC: 32.5 g/dL (ref 30.0–36.0)
MCV: 71.7 fl — AB (ref 78.0–100.0)
MONOS PCT: 7.9 % (ref 3.0–12.0)
Monocytes Absolute: 0.6 10*3/uL (ref 0.1–1.0)
Neutro Abs: 5 10*3/uL (ref 1.4–7.7)
Neutrophils Relative %: 64.6 % (ref 43.0–77.0)
Platelets: 375 10*3/uL (ref 150.0–400.0)
RBC: 5.94 Mil/uL — ABNORMAL HIGH (ref 3.87–5.11)
RDW: 14.9 % (ref 11.5–15.5)
WBC: 7.8 10*3/uL (ref 4.0–10.5)

## 2015-09-14 LAB — LIPID PANEL
CHOL/HDL RATIO: 3
Cholesterol: 205 mg/dL — ABNORMAL HIGH (ref 0–200)
HDL: 76.4 mg/dL (ref >39.00)
LDL Cholesterol: 104 mg/dL — ABNORMAL HIGH (ref 0–99)
NonHDL: 128.43
TRIGLYCERIDES: 121 mg/dL (ref 0.0–149.0)
VLDL: 24.2 mg/dL (ref 0.0–40.0)

## 2015-09-14 LAB — COMPREHENSIVE METABOLIC PANEL
ALBUMIN: 3.7 g/dL (ref 3.5–5.2)
ALK PHOS: 62 U/L (ref 39–117)
ALT: 11 U/L (ref 0–35)
AST: 15 U/L (ref 0–37)
BILIRUBIN TOTAL: 0.5 mg/dL (ref 0.2–1.2)
BUN: 11 mg/dL (ref 6–23)
CALCIUM: 9.3 mg/dL (ref 8.4–10.5)
CO2: 22 mEq/L (ref 19–32)
CREATININE: 0.83 mg/dL (ref 0.40–1.20)
Chloride: 109 mEq/L (ref 96–112)
GFR: 97.45 mL/min (ref >60.00)
Glucose, Bld: 94 mg/dL (ref 70–99)
Potassium: 4.9 mEq/L (ref 3.5–5.1)
Sodium: 140 mEq/L (ref 135–145)
Total Protein: 7.1 g/dL (ref 6.0–8.3)

## 2015-09-14 LAB — TSH: TSH: 0.45 u[IU]/mL (ref 0.35–4.50)

## 2015-09-14 NOTE — Progress Notes (Signed)
Pre visit review using our clinic review tool, if applicable. No additional management support is needed unless otherwise documented below in the visit note. 

## 2015-09-14 NOTE — Patient Instructions (Signed)
Preventive Care for Adults, Female A healthy lifestyle and preventive care can promote health and wellness. Preventive health guidelines for women include the following key practices.  A routine yearly physical is a good way to check with your health care provider about your health and preventive screening. It is a chance to share any concerns and updates on your health and to receive a thorough exam.  Visit your dentist for a routine exam and preventive care every 6 months. Brush your teeth twice a day and floss once a day. Good oral hygiene prevents tooth decay and gum disease.  The frequency of eye exams is based on your age, health, family medical history, use of contact lenses, and other factors. Follow your health care provider's recommendations for frequency of eye exams.  Eat a healthy diet. Foods like vegetables, fruits, whole grains, low-fat dairy products, and lean protein foods contain the nutrients you need without too many calories. Decrease your intake of foods high in solid fats, added sugars, and salt. Eat the right amount of calories for you.Get information about a proper diet from your health care provider, if necessary.  Regular physical exercise is one of the most important things you can do for your health. Most adults should get at least 150 minutes of moderate-intensity exercise (any activity that increases your heart rate and causes you to sweat) each week. In addition, most adults need muscle-strengthening exercises on 2 or more days a week.  Maintain a healthy weight. The body mass index (BMI) is a screening tool to identify possible weight problems. It provides an estimate of body fat based on height and weight. Your health care provider can find your BMI and can help you achieve or maintain a healthy weight.For adults 20 years and older:  A BMI below 18.5 is considered underweight.  A BMI of 18.5 to 24.9 is normal.  A BMI of 25 to 29.9 is considered overweight.  A  BMI of 30 and above is considered obese.  Maintain normal blood lipids and cholesterol levels by exercising and minimizing your intake of saturated fat. Eat a balanced diet with plenty of fruit and vegetables. Blood tests for lipids and cholesterol should begin at age 45 and be repeated every 5 years. If your lipid or cholesterol levels are high, you are over 50, or you are at high risk for heart disease, you may need your cholesterol levels checked more frequently.Ongoing high lipid and cholesterol levels should be treated with medicines if diet and exercise are not working.  If you smoke, find out from your health care provider how to quit. If you do not use tobacco, do not start.  Lung cancer screening is recommended for adults aged 45-80 years who are at high risk for developing lung cancer because of a history of smoking. A yearly low-dose CT scan of the lungs is recommended for people who have at least a 30-pack-year history of smoking and are a current smoker or have quit within the past 15 years. A pack year of smoking is smoking an average of 1 pack of cigarettes a day for 1 year (for example: 1 pack a day for 30 years or 2 packs a day for 15 years). Yearly screening should continue until the smoker has stopped smoking for at least 15 years. Yearly screening should be stopped for people who develop a health problem that would prevent them from having lung cancer treatment.  If you are pregnant, do not drink alcohol. If you are  breastfeeding, be very cautious about drinking alcohol. If you are not pregnant and choose to drink alcohol, do not have more than 1 drink per day. One drink is considered to be 12 ounces (355 mL) of beer, 5 ounces (148 mL) of wine, or 1.5 ounces (44 mL) of liquor.  Avoid use of street drugs. Do not share needles with anyone. Ask for help if you need support or instructions about stopping the use of drugs.  High blood pressure causes heart disease and increases the risk  of stroke. Your blood pressure should be checked at least every 1 to 2 years. Ongoing high blood pressure should be treated with medicines if weight loss and exercise do not work.  If you are 55-79 years old, ask your health care provider if you should take aspirin to prevent strokes.  Diabetes screening is done by taking a blood sample to check your blood glucose level after you have not eaten for a certain period of time (fasting). If you are not overweight and you do not have risk factors for diabetes, you should be screened once every 3 years starting at age 45. If you are overweight or obese and you are 40-70 years of age, you should be screened for diabetes every year as part of your cardiovascular risk assessment.  Breast cancer screening is essential preventive care for women. You should practice "breast self-awareness." This means understanding the normal appearance and feel of your breasts and may include breast self-examination. Any changes detected, no matter how small, should be reported to a health care provider. Women in their 20s and 30s should have a clinical breast exam (CBE) by a health care provider as part of a regular health exam every 1 to 3 years. After age 40, women should have a CBE every year. Starting at age 40, women should consider having a mammogram (breast X-ray test) every year. Women who have a family history of breast cancer should talk to their health care provider about genetic screening. Women at a high risk of breast cancer should talk to their health care providers about having an MRI and a mammogram every year.  Breast cancer gene (BRCA)-related cancer risk assessment is recommended for women who have family members with BRCA-related cancers. BRCA-related cancers include breast, ovarian, tubal, and peritoneal cancers. Having family members with these cancers may be associated with an increased risk for harmful changes (mutations) in the breast cancer genes BRCA1 and  BRCA2. Results of the assessment will determine the need for genetic counseling and BRCA1 and BRCA2 testing.  Your health care provider may recommend that you be screened regularly for cancer of the pelvic organs (ovaries, uterus, and vagina). This screening involves a pelvic examination, including checking for microscopic changes to the surface of your cervix (Pap test). You may be encouraged to have this screening done every 3 years, beginning at age 21.  For women ages 30-65, health care providers may recommend pelvic exams and Pap testing every 3 years, or they may recommend the Pap and pelvic exam, combined with testing for human papilloma virus (HPV), every 5 years. Some types of HPV increase your risk of cervical cancer. Testing for HPV may also be done on women of any age with unclear Pap test results.  Other health care providers may not recommend any screening for nonpregnant women who are considered low risk for pelvic cancer and who do not have symptoms. Ask your health care provider if a screening pelvic exam is right for   you.  If you have had past treatment for cervical cancer or a condition that could lead to cancer, you need Pap tests and screening for cancer for at least 20 years after your treatment. If Pap tests have been discontinued, your risk factors (such as having a new sexual partner) need to be reassessed to determine if screening should resume. Some women have medical problems that increase the chance of getting cervical cancer. In these cases, your health care provider may recommend more frequent screening and Pap tests.  Colorectal cancer can be detected and often prevented. Most routine colorectal cancer screening begins at the age of 50 years and continues through age 75 years. However, your health care provider may recommend screening at an earlier age if you have risk factors for colon cancer. On a yearly basis, your health care provider may provide home test kits to check  for hidden blood in the stool. Use of a small camera at the end of a tube, to directly examine the colon (sigmoidoscopy or colonoscopy), can detect the earliest forms of colorectal cancer. Talk to your health care provider about this at age 50, when routine screening begins. Direct exam of the colon should be repeated every 5-10 years through age 75 years, unless early forms of precancerous polyps or small growths are found.  People who are at an increased risk for hepatitis B should be screened for this virus. You are considered at high risk for hepatitis B if:  You were born in a country where hepatitis B occurs often. Talk with your health care provider about which countries are considered high risk.  Your parents were born in a high-risk country and you have not received a shot to protect against hepatitis B (hepatitis B vaccine).  You have HIV or AIDS.  You use needles to inject street drugs.  You live with, or have sex with, someone who has hepatitis B.  You get hemodialysis treatment.  You take certain medicines for conditions like cancer, organ transplantation, and autoimmune conditions.  Hepatitis C blood testing is recommended for all people born from 1945 through 1965 and any individual with known risks for hepatitis C.  Practice safe sex. Use condoms and avoid high-risk sexual practices to reduce the spread of sexually transmitted infections (STIs). STIs include gonorrhea, chlamydia, syphilis, trichomonas, herpes, HPV, and human immunodeficiency virus (HIV). Herpes, HIV, and HPV are viral illnesses that have no cure. They can result in disability, cancer, and death.  You should be screened for sexually transmitted illnesses (STIs) including gonorrhea and chlamydia if:  You are sexually active and are younger than 24 years.  You are older than 24 years and your health care provider tells you that you are at risk for this type of infection.  Your sexual activity has changed  since you were last screened and you are at an increased risk for chlamydia or gonorrhea. Ask your health care provider if you are at risk.  If you are at risk of being infected with HIV, it is recommended that you take a prescription medicine daily to prevent HIV infection. This is called preexposure prophylaxis (PrEP). You are considered at risk if:  You are sexually active and do not regularly use condoms or know the HIV status of your partner(s).  You take drugs by injection.  You are sexually active with a partner who has HIV.  Talk with your health care provider about whether you are at high risk of being infected with HIV. If   you choose to begin PrEP, you should first be tested for HIV. You should then be tested every 3 months for as long as you are taking PrEP.  Osteoporosis is a disease in which the bones lose minerals and strength with aging. This can result in serious bone fractures or breaks. The risk of osteoporosis can be identified using a bone density scan. Women ages 67 years and over and women at risk for fractures or osteoporosis should discuss screening with their health care providers. Ask your health care provider whether you should take a calcium supplement or vitamin D to reduce the rate of osteoporosis.  Menopause can be associated with physical symptoms and risks. Hormone replacement therapy is available to decrease symptoms and risks. You should talk to your health care provider about whether hormone replacement therapy is right for you.  Use sunscreen. Apply sunscreen liberally and repeatedly throughout the day. You should seek shade when your shadow is shorter than you. Protect yourself by wearing long sleeves, pants, a wide-brimmed hat, and sunglasses year round, whenever you are outdoors.  Once a month, do a whole body skin exam, using a mirror to look at the skin on your back. Tell your health care provider of new moles, moles that have irregular borders, moles that  are larger than a pencil eraser, or moles that have changed in shape or color.  Stay current with required vaccines (immunizations).  Influenza vaccine. All adults should be immunized every year.  Tetanus, diphtheria, and acellular pertussis (Td, Tdap) vaccine. Pregnant women should receive 1 dose of Tdap vaccine during each pregnancy. The dose should be obtained regardless of the length of time since the last dose. Immunization is preferred during the 27th-36th week of gestation. An adult who has not previously received Tdap or who does not know her vaccine status should receive 1 dose of Tdap. This initial dose should be followed by tetanus and diphtheria toxoids (Td) booster doses every 10 years. Adults with an unknown or incomplete history of completing a 3-dose immunization series with Td-containing vaccines should begin or complete a primary immunization series including a Tdap dose. Adults should receive a Td booster every 10 years.  Varicella vaccine. An adult without evidence of immunity to varicella should receive 2 doses or a second dose if she has previously received 1 dose. Pregnant females who do not have evidence of immunity should receive the first dose after pregnancy. This first dose should be obtained before leaving the health care facility. The second dose should be obtained 4-8 weeks after the first dose.  Human papillomavirus (HPV) vaccine. Females aged 13-26 years who have not received the vaccine previously should obtain the 3-dose series. The vaccine is not recommended for use in pregnant females. However, pregnancy testing is not needed before receiving a dose. If a female is found to be pregnant after receiving a dose, no treatment is needed. In that case, the remaining doses should be delayed until after the pregnancy. Immunization is recommended for any person with an immunocompromised condition through the age of 61 years if she did not get any or all doses earlier. During the  3-dose series, the second dose should be obtained 4-8 weeks after the first dose. The third dose should be obtained 24 weeks after the first dose and 16 weeks after the second dose.  Zoster vaccine. One dose is recommended for adults aged 30 years or older unless certain conditions are present.  Measles, mumps, and rubella (MMR) vaccine. Adults born  before 1957 generally are considered immune to measles and mumps. Adults born in 1957 or later should have 1 or more doses of MMR vaccine unless there is a contraindication to the vaccine or there is laboratory evidence of immunity to each of the three diseases. A routine second dose of MMR vaccine should be obtained at least 28 days after the first dose for students attending postsecondary schools, health care workers, or international travelers. People who received inactivated measles vaccine or an unknown type of measles vaccine during 1963-1967 should receive 2 doses of MMR vaccine. People who received inactivated mumps vaccine or an unknown type of mumps vaccine before 1979 and are at high risk for mumps infection should consider immunization with 2 doses of MMR vaccine. For females of childbearing age, rubella immunity should be determined. If there is no evidence of immunity, females who are not pregnant should be vaccinated. If there is no evidence of immunity, females who are pregnant should delay immunization until after pregnancy. Unvaccinated health care workers born before 1957 who lack laboratory evidence of measles, mumps, or rubella immunity or laboratory confirmation of disease should consider measles and mumps immunization with 2 doses of MMR vaccine or rubella immunization with 1 dose of MMR vaccine.  Pneumococcal 13-valent conjugate (PCV13) vaccine. When indicated, a person who is uncertain of his immunization history and has no record of immunization should receive the PCV13 vaccine. All adults 65 years of age and older should receive this  vaccine. An adult aged 19 years or older who has certain medical conditions and has not been previously immunized should receive 1 dose of PCV13 vaccine. This PCV13 should be followed with a dose of pneumococcal polysaccharide (PPSV23) vaccine. Adults who are at high risk for pneumococcal disease should obtain the PPSV23 vaccine at least 8 weeks after the dose of PCV13 vaccine. Adults older than 41 years of age who have normal immune system function should obtain the PPSV23 vaccine dose at least 1 year after the dose of PCV13 vaccine.  Pneumococcal polysaccharide (PPSV23) vaccine. When PCV13 is also indicated, PCV13 should be obtained first. All adults aged 65 years and older should be immunized. An adult younger than age 65 years who has certain medical conditions should be immunized. Any person who resides in a nursing home or long-term care facility should be immunized. An adult smoker should be immunized. People with an immunocompromised condition and certain other conditions should receive both PCV13 and PPSV23 vaccines. People with human immunodeficiency virus (HIV) infection should be immunized as soon as possible after diagnosis. Immunization during chemotherapy or radiation therapy should be avoided. Routine use of PPSV23 vaccine is not recommended for American Indians, [place] Natives, or people younger than 65 years unless there are medical conditions that require PPSV23 vaccine. When indicated, people who have unknown immunization and have no record of immunization should receive PPSV23 vaccine. One-time revaccination 5 years after the first dose of PPSV23 is recommended for people aged 19-64 years who have chronic kidney failure, nephrotic syndrome, asplenia, or immunocompromised conditions. People who received 1-2 doses of PPSV23 before age 65 years should receive another dose of PPSV23 vaccine at age 65 years or later if at least 5 years have passed since the previous dose. Doses of PPSV23 are not  needed for people immunized with PPSV23 at or after age 65 years.  Meningococcal vaccine. Adults with asplenia or persistent complement component deficiencies should receive 2 doses of quadrivalent meningococcal conjugate (MenACWY-D) vaccine. The doses should be obtained   at least 2 months apart. Microbiologists working with certain meningococcal bacteria, [place] recruits, people at risk during an outbreak, and people who travel to or live in countries with a high rate of meningitis should be immunized. A first-year college student up through age 34 years who is living in a residence hall should receive a dose if she did not receive a dose on or after her 16th birthday. Adults who have certain high-risk conditions should receive one or more doses of vaccine.  Hepatitis A vaccine. Adults who wish to be protected from this disease, have certain high-risk conditions, work with hepatitis A-infected animals, work in hepatitis A research labs, or travel to or work in countries with a high rate of hepatitis A should be immunized. Adults who were previously unvaccinated and who anticipate close contact with an international adoptee during the first 60 days after arrival in the [place] from a country with a high rate of hepatitis A should be immunized.  Hepatitis B vaccine. Adults who wish to be protected from this disease, have certain high-risk conditions, may be exposed to blood or other infectious body fluids, are household contacts or sex partners of hepatitis B positive people, are clients or workers in certain care facilities, or travel to or work in countries with a high rate of hepatitis B should be immunized.  Haemophilus influenzae type b (Hib) vaccine. A previously unvaccinated person with asplenia or sickle cell disease or having a scheduled splenectomy should receive 1 dose of Hib vaccine. Regardless of previous immunization, a recipient of a hematopoietic stem cell transplant should receive a  3-dose series 6-12 months after her successful transplant. Hib vaccine is not recommended for adults with HIV infection. Preventive Services / Frequency Ages 35 to 4 years  Blood pressure check.** / Every 3-5 years.  Lipid and cholesterol check.** / Every 5 years beginning at age 60.  Clinical breast exam.** / Every 3 years for women in their 71s and 10s.  BRCA-related cancer risk assessment.** / For women who have family members with a BRCA-related cancer (breast, ovarian, tubal, or peritoneal cancers).  Pap test.** / Every 2 years from ages 76 through 26. Every 3 years starting at age 61 through age 76 or 93 with a history of 3 consecutive normal Pap tests.  HPV screening.** / Every 3 years from ages 37 through ages 60 to 51 with a history of 3 consecutive normal Pap tests.  Hepatitis C blood test.** / For any individual with known risks for hepatitis C.  Skin self-exam. / Monthly.  Influenza vaccine. / Every year.  Tetanus, diphtheria, and acellular pertussis (Tdap, Td) vaccine.** / Consult your health care provider. Pregnant women should receive 1 dose of Tdap vaccine during each pregnancy. 1 dose of Td every 10 years.  Varicella vaccine.** / Consult your health care provider. Pregnant females who do not have evidence of immunity should receive the first dose after pregnancy.  HPV vaccine. / 3 doses over 6 months, if 93 and younger. The vaccine is not recommended for use in pregnant females. However, pregnancy testing is not needed before receiving a dose.  Measles, mumps, rubella (MMR) vaccine.** / You need at least 1 dose of MMR if you were born in 1957 or later. You may also need a 2nd dose. For females of childbearing age, rubella immunity should be determined. If there is no evidence of immunity, females who are not pregnant should be vaccinated. If there is no evidence of immunity, females who are  pregnant should delay immunization until after pregnancy.  Pneumococcal  13-valent conjugate (PCV13) vaccine.** / Consult your health care provider.  Pneumococcal polysaccharide (PPSV23) vaccine.** / 1 to 2 doses if you smoke cigarettes or if you have certain conditions.  Meningococcal vaccine.** / 1 dose if you are age 68 to 8 years and a Market researcher living in a residence hall, or have one of several medical conditions, you need to get vaccinated against meningococcal disease. You may also need additional booster doses.  Hepatitis A vaccine.** / Consult your health care provider.  Hepatitis B vaccine.** / Consult your health care provider.  Haemophilus influenzae type b (Hib) vaccine.** / Consult your health care provider. Ages 7 to 53 years  Blood pressure check.** / Every year.  Lipid and cholesterol check.** / Every 5 years beginning at age 25 years.  Lung cancer screening. / Every year if you are aged 11-80 years and have a 30-pack-year history of smoking and currently smoke or have quit within the past 15 years. Yearly screening is stopped once you have quit smoking for at least 15 years or develop a health problem that would prevent you from having lung cancer treatment.  Clinical breast exam.** / Every year after age 48 years.  BRCA-related cancer risk assessment.** / For women who have family members with a BRCA-related cancer (breast, ovarian, tubal, or peritoneal cancers).  Mammogram.** / Every year beginning at age 41 years and continuing for as long as you are in good health. Consult with your health care provider.  Pap test.** / Every 3 years starting at age 65 years through age 37 or 70 years with a history of 3 consecutive normal Pap tests.  HPV screening.** / Every 3 years from ages 72 years through ages 60 to 40 years with a history of 3 consecutive normal Pap tests.  Fecal occult blood test (FOBT) of stool. / Every year beginning at age 21 years and continuing until age 5 years. You may not need to do this test if you get  a colonoscopy every 10 years.  Flexible sigmoidoscopy or colonoscopy.** / Every 5 years for a flexible sigmoidoscopy or every 10 years for a colonoscopy beginning at age 35 years and continuing until age 48 years.  Hepatitis C blood test.** / For all people born from [birth year] through 1965 and any individual with known risks for hepatitis C.  Skin self-exam. / Monthly.  Influenza vaccine. / Every year.  Tetanus, diphtheria, and acellular pertussis (Tdap/Td) vaccine.** / Consult your health care provider. Pregnant women should receive 1 dose of Tdap vaccine during each pregnancy. 1 dose of Td every 10 years.  Varicella vaccine.** / Consult your health care provider. Pregnant females who do not have evidence of immunity should receive the first dose after pregnancy.  Zoster vaccine.** / 1 dose for adults aged 30 years or older.  Measles, mumps, rubella (MMR) vaccine.** / You need at least 1 dose of MMR if you were born in 1957 or later. You may also need a second dose. For females of childbearing age, rubella immunity should be determined. If there is no evidence of immunity, females who are not pregnant should be vaccinated. If there is no evidence of immunity, females who are pregnant should delay immunization until after pregnancy.  Pneumococcal 13-valent conjugate (PCV13) vaccine.** / Consult your health care provider.  Pneumococcal polysaccharide (PPSV23) vaccine.** / 1 to 2 doses if you smoke cigarettes or if you have certain conditions.  Meningococcal vaccine.** /  Consult your health care provider.  Hepatitis A vaccine.** / Consult your health care provider.  Hepatitis B vaccine.** / Consult your health care provider.  Haemophilus influenzae type b (Hib) vaccine.** / Consult your health care provider. Ages 64 years and over  Blood pressure check.** / Every year.  Lipid and cholesterol check.** / Every 5 years beginning at age 23 years.  Lung cancer screening. / Every year if you  are aged 16-80 years and have a 30-pack-year history of smoking and currently smoke or have quit within the past 15 years. Yearly screening is stopped once you have quit smoking for at least 15 years or develop a health problem that would prevent you from having lung cancer treatment.  Clinical breast exam.** / Every year after age 74 years.  BRCA-related cancer risk assessment.** / For women who have family members with a BRCA-related cancer (breast, ovarian, tubal, or peritoneal cancers).  Mammogram.** / Every year beginning at age 44 years and continuing for as long as you are in good health. Consult with your health care provider.  Pap test.** / Every 3 years starting at age 58 years through age 22 or 39 years with 3 consecutive normal Pap tests. Testing can be stopped between 65 and 70 years with 3 consecutive normal Pap tests and no abnormal Pap or HPV tests in the past 10 years.  HPV screening.** / Every 3 years from ages 64 years through ages 70 or 61 years with a history of 3 consecutive normal Pap tests. Testing can be stopped between 65 and 70 years with 3 consecutive normal Pap tests and no abnormal Pap or HPV tests in the past 10 years.  Fecal occult blood test (FOBT) of stool. / Every year beginning at age 40 years and continuing until age 27 years. You may not need to do this test if you get a colonoscopy every 10 years.  Flexible sigmoidoscopy or colonoscopy.** / Every 5 years for a flexible sigmoidoscopy or every 10 years for a colonoscopy beginning at age 7 years and continuing until age 32 years.  Hepatitis C blood test.** / For all people born from [birth year] through 1966 and any individual with known risks for hepatitis C.  Osteoporosis screening.** / A one-time screening for women ages 30 years and over and women at risk for fractures or osteoporosis.  Skin self-exam. / Monthly.  Influenza vaccine. / Every year.  Tetanus, diphtheria, and acellular pertussis (Tdap/Td)  vaccine.** / 1 dose of Td every 10 years.  Varicella vaccine.** / Consult your health care provider.  Zoster vaccine.** / 1 dose for adults aged 35 years or older.  Pneumococcal 13-valent conjugate (PCV13) vaccine.** / Consult your health care provider.  Pneumococcal polysaccharide (PPSV23) vaccine.** / 1 dose for all adults aged 46 years and older.  Meningococcal vaccine.** / Consult your health care provider.  Hepatitis A vaccine.** / Consult your health care provider.  Hepatitis B vaccine.** / Consult your health care provider.  Haemophilus influenzae type b (Hib) vaccine.** / Consult your health care provider. ** Family history and personal history of risk and conditions may change your health care provider's recommendations.   This information is not intended to replace advice given to you by your health care provider. Make sure you discuss any questions you have with your health care provider.   Document Released: 07/12/2001 Document Revised: 06/06/2014 Document Reviewed: 10/11/2010 Elsevier Interactive Patient Education [employer].

## 2015-09-14 NOTE — Progress Notes (Signed)
Subjective:  Patient ID: Stephanie Mendoza, female    DOB: [birth year]  Age: 40 y.o. MRN: [id number]  CC: Annual Exam   HPI Stephanie Mendoza presents for a CPX - She feels well and offers no complaints.  Outpatient Prescriptions Prior to Visit  Medication Sig Dispense Refill  . etonogestrel-ethinyl estradiol (NUVARING) 0.12-0.015 MG/24HR vaginal ring Insert vaginally and leave in place for 3 consecutive weeks, then remove for 1 week. 1 each 12   No facility-administered medications prior to visit.    ROS Review of Systems  All other systems reviewed and are negative.   Objective:  BP 122/86 mmHg  Pulse 82  Temp(Src) 98.5 F (36.9 C) (Oral)  Resp 16  Ht 5' 4"  (1.626 m)  Wt 170 lb (77.111 kg)  BMI 29.17 kg/m2  SpO2 98%  LMP 08/18/2015  BP Readings from Last 3 Encounters:  09/14/15 122/86  09/11/14 120/78  10/15/13 136/95    Wt Readings from Last 3 Encounters:  09/14/15 170 lb (77.111 kg)  09/11/14 162 lb (73.483 kg)  08/09/13 155 lb 3.2 oz (70.398 kg)    Physical Exam  Constitutional: She is oriented to person, place, and time. She appears well-developed and well-nourished. No distress.  HENT:  Head: Normocephalic and atraumatic.  Mouth/Throat: Oropharynx is clear and moist. No oropharyngeal exudate.  Eyes: Conjunctivae are normal. Right eye exhibits no discharge. Left eye exhibits no discharge. No scleral icterus.  Neck: Normal range of motion. Neck supple. No JVD present. No tracheal deviation present. No thyromegaly present.  Cardiovascular: Normal rate, regular rhythm, normal heart sounds and intact distal pulses.  Exam reveals no gallop and no friction rub.   No murmur heard. Pulmonary/Chest: Effort normal and breath sounds normal. No stridor. No respiratory distress. She has no wheezes. She has no rales. She exhibits no tenderness.  Abdominal: Soft. Bowel sounds are normal. She exhibits no distension and no mass. There is no tenderness. There is no rebound  and no guarding.  Musculoskeletal: Normal range of motion. She exhibits no edema or tenderness.  Lymphadenopathy:    She has no cervical adenopathy.  Neurological: She is oriented to person, place, and time.  Skin: Skin is warm and dry. No rash noted. She is not diaphoretic. No erythema. No pallor.  Psychiatric: She has a normal mood and affect. Her behavior is normal. Judgment and thought content normal.  Vitals reviewed.   Lab Results  Component Value Date   WBC 7.8 09/14/2015   HGB 13.8 09/14/2015   HCT 42.5 09/14/2015   PLT 375.0 09/14/2015   GLUCOSE 94 09/14/2015   CHOL 205* 09/14/2015   TRIG 121.0 09/14/2015   HDL 76.40 09/14/2015   LDLDIRECT 88.6 08/12/2011   LDLCALC 104* 09/14/2015   ALT 11 09/14/2015   AST 15 09/14/2015   NA 140 09/14/2015   K 4.9 09/14/2015   CL 109 09/14/2015   CREATININE 0.83 09/14/2015   BUN 11 09/14/2015   CO2 22 09/14/2015   TSH 0.45 09/14/2015    No results found.  Assessment & Plan:   Luanna was seen today for annual exam.  Diagnoses and all orders for this visit:  Routine general medical examination at a health care facility- exam done, labs ordered and reviewed, vaccines reviewed and updated, her Pap and mammogram are up-to-date, patient education material was given. -     Lipid panel; Future -     TSH; Future -     CBC with Differential/Platelet; Future -  Comprehensive metabolic panel; Future  I am having Ms. Prospero maintain her etonogestrel-ethinyl estradiol.  No orders of the defined types were placed in this encounter.     Follow-up: Return if symptoms worsen or fail to improve.  Scarlette Calico, MD

## 2016-03-08 LAB — HM MAMMOGRAPHY: HM MAMMO: NORMAL (ref 0–4)

## 2016-03-09 LAB — HM PAP SMEAR

## 2016-09-14 ENCOUNTER — Encounter: Payer: Self-pay | Admitting: Internal Medicine

## 2016-09-14 ENCOUNTER — Other Ambulatory Visit (INDEPENDENT_AMBULATORY_CARE_PROVIDER_SITE_OTHER): Payer: BLUE CROSS/BLUE SHIELD

## 2016-09-14 ENCOUNTER — Ambulatory Visit (INDEPENDENT_AMBULATORY_CARE_PROVIDER_SITE_OTHER): Payer: BLUE CROSS/BLUE SHIELD | Admitting: Internal Medicine

## 2016-09-14 VITALS — BP 108/80 | HR 81 | Temp 98.5°F | Resp 16 | Ht 64.0 in | Wt 171.5 lb

## 2016-09-14 DIAGNOSIS — Z0000 Encounter for general adult medical examination without abnormal findings: Secondary | ICD-10-CM | POA: Diagnosis not present

## 2016-09-14 LAB — CBC WITH DIFFERENTIAL/PLATELET
BASOS ABS: 0.1 10*3/uL (ref 0.0–0.1)
BASOS PCT: 1 % (ref 0.0–3.0)
EOS ABS: 0.1 10*3/uL (ref 0.0–0.7)
Eosinophils Relative: 1.4 % (ref 0.0–5.0)
HCT: 43.2 % (ref 36.0–46.0)
Hemoglobin: 14 g/dL (ref 12.0–15.0)
LYMPHS ABS: 2 10*3/uL (ref 0.7–4.0)
Lymphocytes Relative: 24.3 % (ref 12.0–46.0)
MCHC: 32.4 g/dL (ref 30.0–36.0)
MCV: 72.2 fl — AB (ref 78.0–100.0)
Monocytes Absolute: 0.5 10*3/uL (ref 0.1–1.0)
Monocytes Relative: 6.7 % (ref 3.0–12.0)
NEUTROS ABS: 5.5 10*3/uL (ref 1.4–7.7)
NEUTROS PCT: 66.6 % (ref 43.0–77.0)
Platelets: 369 10*3/uL (ref 150.0–400.0)
RBC: 5.99 Mil/uL — ABNORMAL HIGH (ref 3.87–5.11)
RDW: 14.9 % (ref 11.5–15.5)
WBC: 8.2 10*3/uL (ref 4.0–10.5)

## 2016-09-14 LAB — COMPREHENSIVE METABOLIC PANEL
ALT: 12 U/L (ref 0–35)
AST: 16 U/L (ref 0–37)
Albumin: 3.7 g/dL (ref 3.5–5.2)
Alkaline Phosphatase: 65 U/L (ref 39–117)
BILIRUBIN TOTAL: 0.5 mg/dL (ref 0.2–1.2)
BUN: 14 mg/dL (ref 6–23)
CO2: 24 meq/L (ref 19–32)
Calcium: 9.2 mg/dL (ref 8.4–10.5)
Chloride: 107 mEq/L (ref 96–112)
Creatinine, Ser: 0.94 mg/dL (ref 0.40–1.20)
GFR: 84 mL/min (ref >60.00)
GLUCOSE: 99 mg/dL (ref 70–99)
POTASSIUM: 4.2 meq/L (ref 3.5–5.1)
SODIUM: 137 meq/L (ref 135–145)
Total Protein: 7.2 g/dL (ref 6.0–8.3)

## 2016-09-14 LAB — LIPID PANEL
CHOL/HDL RATIO: 3
Cholesterol: 212 mg/dL — ABNORMAL HIGH (ref 0–200)
HDL: 83.6 mg/dL (ref >39.00)
LDL Cholesterol: 106 mg/dL — ABNORMAL HIGH (ref 0–99)
NONHDL: 128.7
Triglycerides: 115 mg/dL (ref 0.0–149.0)
VLDL: 23 mg/dL (ref 0.0–40.0)

## 2016-09-14 NOTE — Progress Notes (Signed)
Pre visit review using our clinic review tool, if applicable. No additional management support is needed unless otherwise documented below in the visit note. 

## 2016-09-14 NOTE — Progress Notes (Signed)
Subjective:  Patient ID: Stephanie Mendoza, female    DOB: 1976-03-29  Age: 41 y.o. MRN: [id number]  CC: Annual Exam   HPI Stephanie Mendoza presents for a CPX - she feels well today, has no complaints.  Outpatient Medications Prior to Visit  Medication Sig Dispense Refill  . etonogestrel-ethinyl estradiol (NUVARING) 0.12-0.015 MG/24HR vaginal ring Insert vaginally and leave in place for 3 consecutive weeks, then remove for 1 week. 1 each 12   No facility-administered medications prior to visit.     ROS Review of Systems  All other systems reviewed and are negative.   Objective:  BP 108/80 (BP Location: Right Arm, Patient Position: Sitting, Cuff Size: Normal)   Pulse 81   Temp 98.5 F (36.9 C) (Oral)   Resp 16   Ht 5' 4"  (1.626 m)   Wt 171 lb 8 oz (77.8 kg)   LMP 08/18/2016 (Within Days)   SpO2 98%   BMI 29.44 kg/m   BP Readings from Last 3 Encounters:  09/14/16 108/80  09/14/15 122/86  09/11/14 120/78    Wt Readings from Last 3 Encounters:  09/14/16 171 lb 8 oz (77.8 kg)  09/14/15 170 lb (77.1 kg)  09/11/14 162 lb (73.5 kg)    Physical Exam  Constitutional: She is oriented to person, place, and time. No distress.  HENT:  Mouth/Throat: Oropharynx is clear and moist. No oropharyngeal exudate.  Eyes: Conjunctivae are normal. Right eye exhibits no discharge. Left eye exhibits no discharge. No scleral icterus.  Neck: Normal range of motion. Neck supple. No JVD present. No tracheal deviation present. No thyromegaly present.  Cardiovascular: Normal rate, regular rhythm, normal heart sounds and intact distal pulses.  Exam reveals no gallop and no friction rub.   No murmur heard. Pulmonary/Chest: Breath sounds normal. No stridor. No respiratory distress. She has no wheezes. She has no rales. She exhibits no tenderness.  Abdominal: Soft. Bowel sounds are normal. She exhibits no distension and no mass. There is no tenderness. There is no rebound and no guarding.    Musculoskeletal: Normal range of motion. She exhibits no edema, tenderness or deformity.  Lymphadenopathy:    She has no cervical adenopathy.  Neurological: She is oriented to person, place, and time.  Skin: Skin is warm and dry. No rash noted. She is not diaphoretic. No erythema. No pallor.  Vitals reviewed.   Lab Results  Component Value Date   WBC 8.2 09/14/2016   HGB 14.0 09/14/2016   HCT 43.2 09/14/2016   PLT 369.0 09/14/2016   GLUCOSE 99 09/14/2016   CHOL 212 (H) 09/14/2016   TRIG 115.0 09/14/2016   HDL 83.60 09/14/2016   LDLDIRECT 88.6 08/12/2011   LDLCALC 106 (H) 09/14/2016   ALT 12 09/14/2016   AST 16 09/14/2016   NA 137 09/14/2016   K 4.2 09/14/2016   CL 107 09/14/2016   CREATININE 0.94 09/14/2016   BUN 14 09/14/2016   CO2 24 09/14/2016   TSH 0.45 09/14/2015    No results found.  Assessment & Plan:   Stephanie Mendoza was seen today for annual exam.  Diagnoses and all orders for this visit:  Routine general medical examination at a health care facility- Exam completed, labs reviewed, she refused a flu vaccine, she has a low Framingham risk score so I don't recommend a statin, her Pap and mammogram are up-to-date, patient education material was given. -     Lipid panel; Future -     Comprehensive metabolic panel; Future -  CBC with Differential/Platelet; Future   I am having Stephanie Mendoza maintain her etonogestrel-ethinyl estradiol.  No orders of the defined types were placed in this encounter.    Follow-up: Return if symptoms worsen or fail to improve.  Scarlette Calico, MD

## 2016-09-14 NOTE — Patient Instructions (Signed)
Preventive Care 18-39 Years, Female Preventive care refers to lifestyle choices and visits with your health care provider that can promote health and wellness. What does preventive care include?  A yearly physical exam. This is also called an annual well check.  Dental exams once or twice a year.  Routine eye exams. Ask your health care provider how often you should have your eyes checked.  Personal lifestyle choices, including:  Daily care of your teeth and gums.  Regular physical activity.  Eating a healthy diet.  Avoiding tobacco and drug use.  Limiting alcohol use.  Practicing safe sex.  Taking vitamin and mineral supplements as recommended by your health care provider. What happens during an annual well check? The services and screenings done by your health care provider during your annual well check will depend on your age, overall health, lifestyle risk factors, and family history of disease. Counseling  Your health care provider may ask you questions about your:  Alcohol use.  Tobacco use.  Drug use.  Emotional well-being.  Home and relationship well-being.  Sexual activity.  Eating habits.  Work and work environment.  Method of birth control.  Menstrual cycle.  Pregnancy history. Screening  You may have the following tests or measurements:  Height, weight, and BMI.  Diabetes screening. This is done by checking your blood sugar (glucose) after you have not eaten for a while (fasting).  Blood pressure.  Lipid and cholesterol levels. These may be checked every 5 years starting at age 20.  Skin check.  Hepatitis C blood test.  Hepatitis B blood test.  Sexually transmitted disease (STD) testing.  BRCA-related cancer screening. This may be done if you have a family history of breast, ovarian, tubal, or peritoneal cancers.  Pelvic exam and Pap test. This may be done every 3 years starting at age 21. Starting at age 30, this may be done every 5  years if you have a Pap test in combination with an HPV test. Discuss your test results, treatment options, and if necessary, the need for more tests with your health care provider. Vaccines  Your health care provider may recommend certain vaccines, such as:  Influenza vaccine. This is recommended every year.  Tetanus, diphtheria, and acellular pertussis (Tdap, Td) vaccine. You may need a Td booster every 10 years.  Varicella vaccine. You may need this if you have not been vaccinated.  HPV vaccine. If you are 26 or younger, you may need three doses over 6 months.  Measles, mumps, and rubella (MMR) vaccine. You may need at least one dose of MMR. You may also need a second dose.  Pneumococcal 13-valent conjugate (PCV13) vaccine. You may need this if you have certain conditions and were not previously vaccinated.  Pneumococcal polysaccharide (PPSV23) vaccine. You may need one or two doses if you smoke cigarettes or if you have certain conditions.  Meningococcal vaccine. One dose is recommended if you are age 19-21 years and a first-year college student living in a residence hall, or if you have one of several medical conditions. You may also need additional booster doses.  Hepatitis A vaccine. You may need this if you have certain conditions or if you travel or work in places where you may be exposed to hepatitis A.  Hepatitis B vaccine. You may need this if you have certain conditions or if you travel or work in places where you may be exposed to hepatitis B.  Haemophilus influenzae type b (Hib) vaccine. You may need this   if you have certain risk factors. Talk to your health care provider about which screenings and vaccines you need and how often you need them. This information is not intended to replace advice given to you by your health care provider. Make sure you discuss any questions you have with your health care provider. Document Released: 07/12/2001 Document Revised: 02/03/2016  Document Reviewed: 03/17/2015 Elsevier Interactive Patient Education  2017 [employer].

## 2017-04-03 DIAGNOSIS — Z1231 Encounter for screening mammogram for malignant neoplasm of breast: Secondary | ICD-10-CM | POA: Diagnosis not present

## 2017-04-03 DIAGNOSIS — Z6829 Body mass index (BMI) 29.0-29.9, adult: Secondary | ICD-10-CM | POA: Diagnosis not present

## 2017-04-03 DIAGNOSIS — Z01419 Encounter for gynecological examination (general) (routine) without abnormal findings: Secondary | ICD-10-CM | POA: Diagnosis not present

## 2017-04-04 ENCOUNTER — Other Ambulatory Visit: Payer: Self-pay | Admitting: Obstetrics and Gynecology

## 2017-04-04 DIAGNOSIS — R928 Other abnormal and inconclusive findings on diagnostic imaging of breast: Secondary | ICD-10-CM

## 2017-04-12 ENCOUNTER — Ambulatory Visit
Admission: RE | Admit: 2017-04-12 | Discharge: 2017-04-12 | Disposition: A | Discharge status: Home / Self Care | Payer: BLUE CROSS/BLUE SHIELD | Source: Ambulatory Visit | Attending: Obstetrics and Gynecology | Admitting: Obstetrics and Gynecology

## 2017-04-12 DIAGNOSIS — R921 Mammographic calcification found on diagnostic imaging of breast: Secondary | ICD-10-CM | POA: Diagnosis not present

## 2017-04-12 DIAGNOSIS — N6321 Unspecified lump in the left breast, upper outer quadrant: Secondary | ICD-10-CM | POA: Diagnosis not present

## 2017-04-12 DIAGNOSIS — R928 Other abnormal and inconclusive findings on diagnostic imaging of breast: Secondary | ICD-10-CM

## 2017-05-05 ENCOUNTER — Other Ambulatory Visit: Payer: BLUE CROSS/BLUE SHIELD

## 2017-07-13 ENCOUNTER — Ambulatory Visit: Payer: BLUE CROSS/BLUE SHIELD | Admitting: Internal Medicine

## 2017-07-13 DIAGNOSIS — J111 Influenza due to unidentified influenza virus with other respiratory manifestations: Secondary | ICD-10-CM | POA: Diagnosis not present

## 2018-05-09 DIAGNOSIS — Z1231 Encounter for screening mammogram for malignant neoplasm of breast: Secondary | ICD-10-CM | POA: Diagnosis not present

## 2018-05-14 DIAGNOSIS — Z01419 Encounter for gynecological examination (general) (routine) without abnormal findings: Secondary | ICD-10-CM | POA: Diagnosis not present

## 2018-05-14 DIAGNOSIS — Z6830 Body mass index (BMI) 30.0-30.9, adult: Secondary | ICD-10-CM | POA: Diagnosis not present

## 2018-07-04 DIAGNOSIS — Z713 Dietary counseling and surveillance: Secondary | ICD-10-CM | POA: Diagnosis not present

## 2018-07-12 LAB — HM MAMMOGRAPHY: HM Mammogram: NORMAL (ref 0–4)

## 2018-08-05 IMAGING — MG 2D DIGITAL DIAGNOSTIC UNILATERAL LEFT MAMMOGRAM WITH CAD AND ADJ
6 series · 6 of 14 positions shown · non-contrast
Comparison: Previous exams including recent screening mammogram
dated 04/03/2017.

CLINICAL DATA: Patient returns today to evaluate a possible left
breast mass questioned on recent screening mammogram.

EXAM:
2D DIGITAL DIAGNOSTIC LEFT MAMMOGRAM WITH CAD AND ADJUNCT TOMO
ULTRASOUND LEFT BREAST

[L MLO synth-2D]
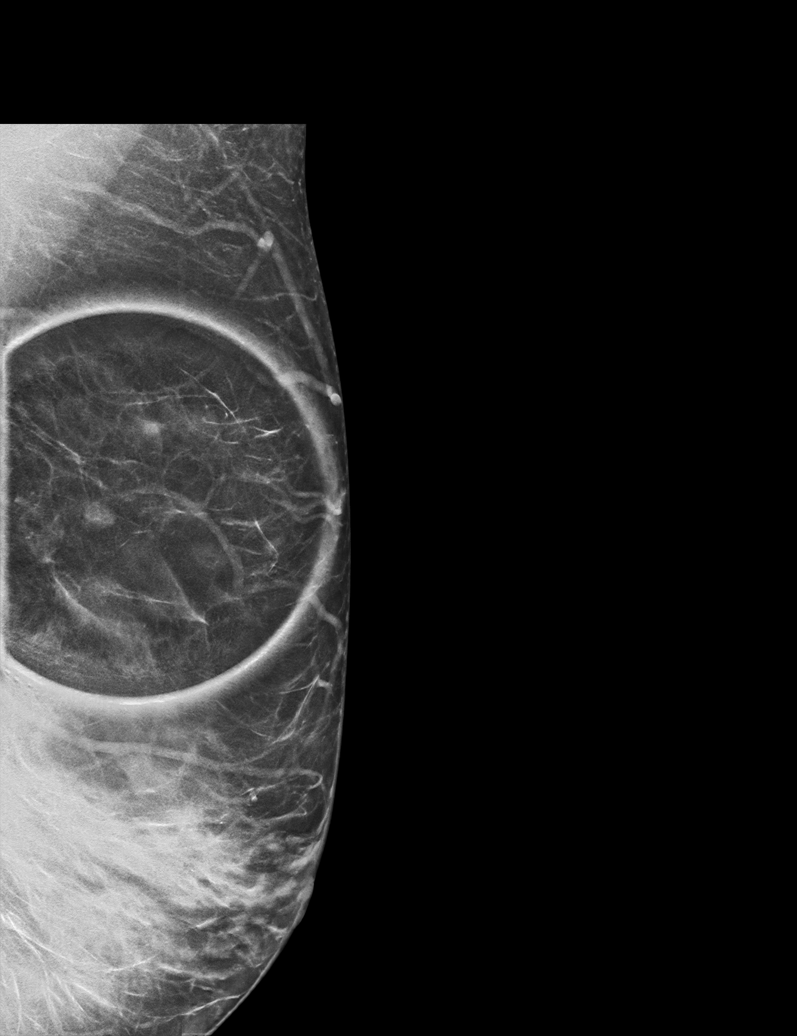

[L MLO]
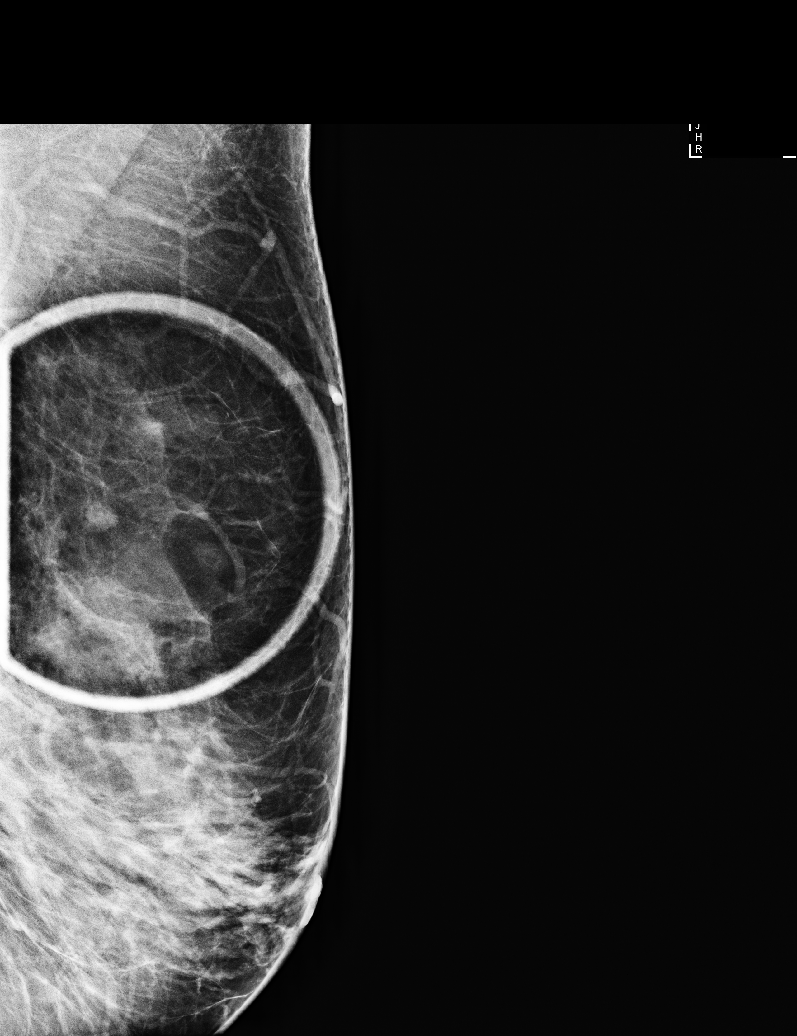

[L CC]
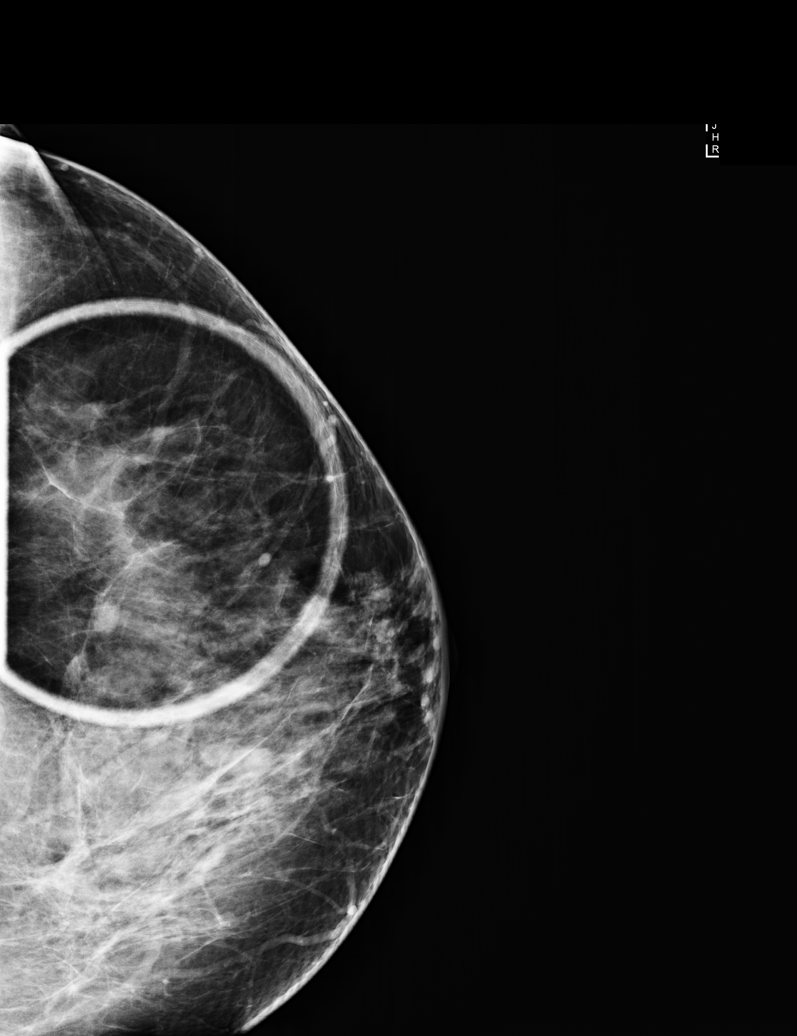

[L CC synth-2D]
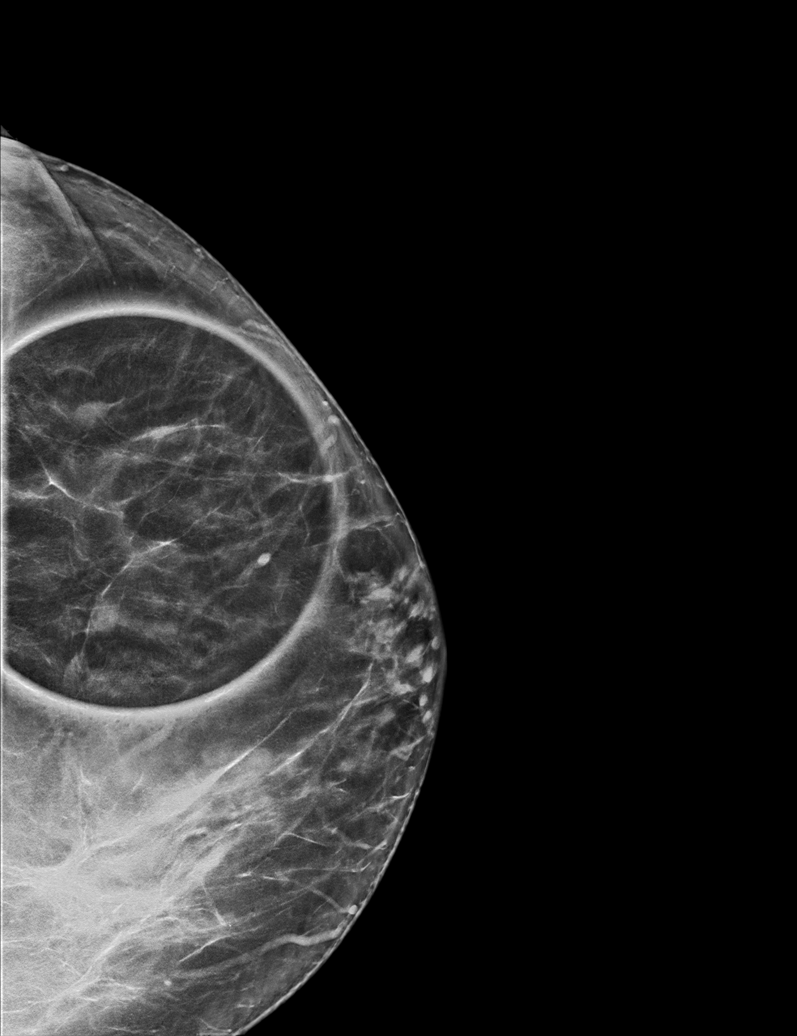

[L CC tomo · tomo slice 39/77.0]
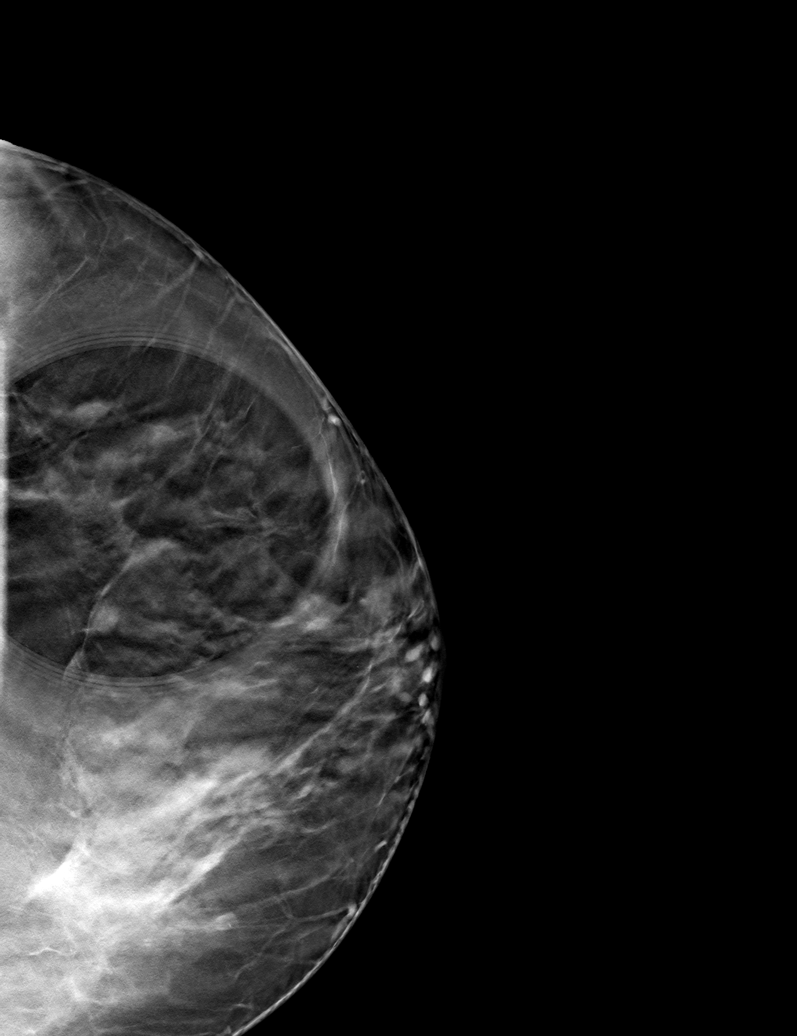

[L MLO tomo · tomo slice 33/64.0]
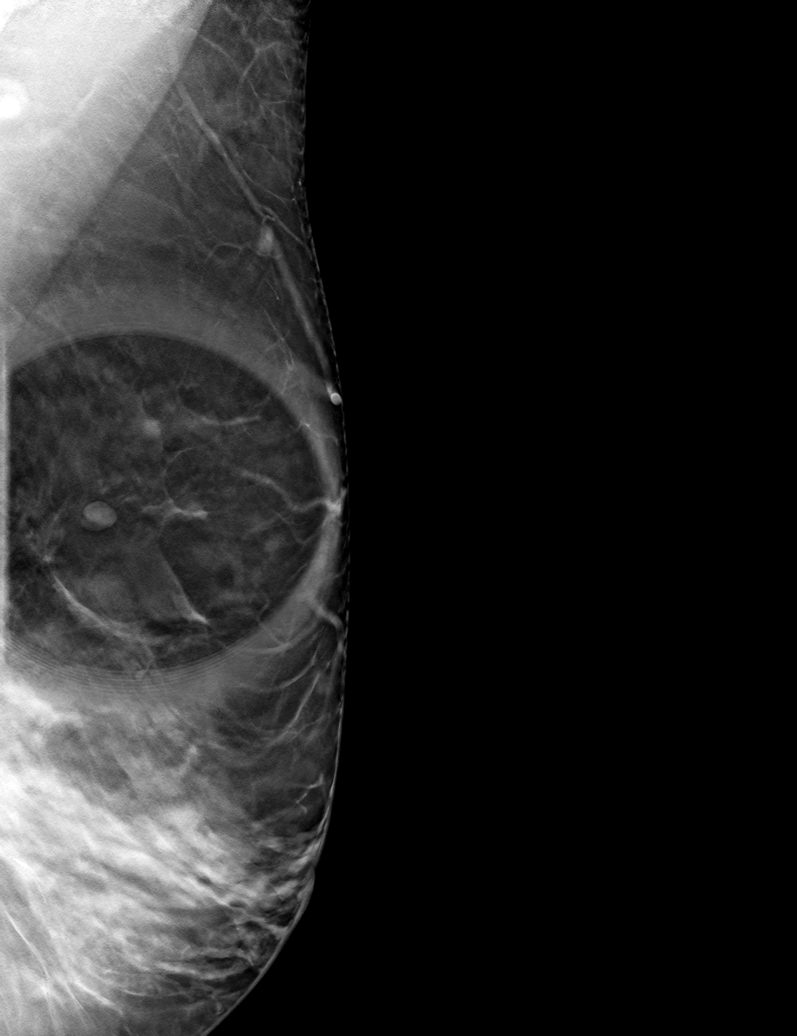

[6 of 14 positions shown; findings below may reference images not displayed]

ACR Breast Density Category c: The breast tissue is heterogeneously
dense, which may obscure small masses.
FINDINGS: On today's additional views with spot compression and 3D
tomosynthesis, an oval circumscribed mass is confirmed within the
slightly outer left breast, 2-3 o'clock axis, at posterior depth,
measuring approximately 5 mm. Additional partially obscured mass is
seen within the upper-outer quadrant of the left breast, also at
posterior depth, measuring approximately 7 mm greatest dimension.

Mammographic images were processed with CAD.

Targeted ultrasound is performed, showing multiple benign cysts
within the left breast, including a benign cluster of cysts at the 1
o'clock axis, 7 cm from the nipple, measuring 8 mm, a likely
correlate for the mammographic finding. A benign intramammary lymph
node is seen in the left breast at the 2 o'clock axis, 8 cm from
nipple, measuring 5 mm, also likely correlate for the mammographic
finding. Scattered smaller cysts within the upper-outer quadrant
correspond as incidental findings.

No suspicious solid or cystic mass is identified within the
upper-outer left breast.
IMPRESSION: No evidence of malignancy. Multiple benign cysts within the
upper-outer quadrant of the left breast, largest measuring 8 mm,
corresponding to the mammographic findings.

Patient may return to routine annual bilateral screening mammogram
schedule.

RECOMMENDATION:
Screening mammogram in one year.(Code:[id number])

I have discussed the findings and recommendations with the patient.
Results were also provided in writing at the conclusion of the
visit. If applicable, a reminder letter will be sent to the patient
regarding the next appointment.

BI-RADS CATEGORY  2: Benign.

## 2018-08-09 ENCOUNTER — Other Ambulatory Visit: Payer: Self-pay

## 2018-08-09 ENCOUNTER — Other Ambulatory Visit (INDEPENDENT_AMBULATORY_CARE_PROVIDER_SITE_OTHER): Payer: BLUE CROSS/BLUE SHIELD

## 2018-08-09 ENCOUNTER — Encounter: Payer: Self-pay | Admitting: Internal Medicine

## 2018-08-09 ENCOUNTER — Ambulatory Visit (INDEPENDENT_AMBULATORY_CARE_PROVIDER_SITE_OTHER): Payer: BLUE CROSS/BLUE SHIELD | Admitting: Internal Medicine

## 2018-08-09 VITALS — BP 116/78 | HR 96 | Temp 98.7°F | Resp 16 | Ht 64.0 in | Wt 179.0 lb

## 2018-08-09 DIAGNOSIS — Z0000 Encounter for general adult medical examination without abnormal findings: Secondary | ICD-10-CM

## 2018-08-09 LAB — LIPID PANEL
Cholesterol: 203 mg/dL — ABNORMAL HIGH (ref 0–200)
HDL: 79.8 mg/dL (ref >39.00)
LDL Cholesterol: 86 mg/dL (ref 0–99)
NonHDL: 122.86
Total CHOL/HDL Ratio: 3
Triglycerides: 182 mg/dL — ABNORMAL HIGH (ref 0.0–149.0)
VLDL: 36.4 mg/dL (ref 0.0–40.0)

## 2018-08-09 NOTE — Progress Notes (Signed)
Subjective:  Patient ID: Stephanie Mendoza, female    DOB: May 15, 1976  Age: 43 y.o. MRN: [id number]  CC: Annual Exam   HPI Stephanie Mendoza presents for a CPX- She feels well today and offers no complaints.  Outpatient Medications Prior to Visit  Medication Sig Dispense Refill   etonogestrel-ethinyl estradiol (NUVARING) 0.12-0.015 MG/24HR vaginal ring Insert vaginally and leave in place for 3 consecutive weeks, then remove for 1 week. 1 each 12   No facility-administered medications prior to visit.     ROS Review of Systems  Constitutional: Negative.  Negative for diaphoresis, fatigue and unexpected weight change.  HENT: Negative.   Eyes: Negative for visual disturbance.  Respiratory: Negative for cough, chest tightness, shortness of breath and wheezing.   Cardiovascular: Negative for chest pain, palpitations and leg swelling.  Gastrointestinal: Negative for abdominal pain and constipation.  Genitourinary: Negative.  Negative for difficulty urinating.  Musculoskeletal: Negative.   Skin: Negative.   Neurological: Negative.  Negative for dizziness, weakness, light-headedness and headaches.  Hematological: Negative for adenopathy. Does not bruise/bleed easily.  Psychiatric/Behavioral: Negative.     Objective:  BP 116/78 (BP Location: Left Arm, Patient Position: Sitting, Cuff Size: Normal)    Pulse 96    Temp 98.7 F (37.1 C) (Oral)    Resp 16    Ht 5' 4"  (1.626 m)    Wt 179 lb (81.2 kg)    SpO2 97%    BMI 30.73 kg/m   BP Readings from Last 3 Encounters:  08/09/18 116/78  09/14/16 108/80  09/14/15 122/86    Wt Readings from Last 3 Encounters:  08/09/18 179 lb (81.2 kg)  09/14/16 171 lb 8 oz (77.8 kg)  09/14/15 170 lb (77.1 kg)    Physical Exam Vitals signs reviewed.  Constitutional:      Appearance: She is not ill-appearing or diaphoretic.  HENT:     Nose: Nose normal. No congestion or rhinorrhea.     Mouth/Throat:     Mouth: Mucous membranes are moist.   Pharynx: No oropharyngeal exudate or posterior oropharyngeal erythema.  Eyes:     General: No scleral icterus.    Conjunctiva/sclera: Conjunctivae normal.  Neck:     Musculoskeletal: Normal range of motion and neck supple. No muscular tenderness.  Cardiovascular:     Rate and Rhythm: Normal rate and regular rhythm.     Heart sounds: No murmur. No gallop.   Pulmonary:     Breath sounds: No stridor. No wheezing, rhonchi or rales.  Abdominal:     General: Abdomen is flat.     Palpations: There is no hepatomegaly, splenomegaly or mass.     Tenderness: There is no abdominal tenderness. There is no guarding.  Musculoskeletal: Normal range of motion.        General: No swelling.     Right lower leg: No edema.     Left lower leg: No edema.  Lymphadenopathy:     Cervical: No cervical adenopathy.  Skin:    General: Skin is warm and dry.     Coloration: Skin is not pale.  Neurological:     General: No focal deficit present.     Mental Status: She is oriented to person, place, and time. Mental status is at baseline.     Lab Results  Component Value Date   WBC 8.2 09/14/2016   HGB 14.0 09/14/2016   HCT 43.2 09/14/2016   PLT 369.0 09/14/2016   GLUCOSE 99 09/14/2016   CHOL 203 (H)  08/09/2018   TRIG 182.0 (H) 08/09/2018   HDL 79.80 08/09/2018   LDLDIRECT 88.6 08/12/2011   LDLCALC 86 08/09/2018   ALT 12 09/14/2016   AST 16 09/14/2016   NA 137 09/14/2016   K 4.2 09/14/2016   CL 107 09/14/2016   CREATININE 0.94 09/14/2016   BUN 14 09/14/2016   CO2 24 09/14/2016   TSH 0.45 09/14/2015    US Breast Ltd Uni Left Inc Axilla  Result Date: 04/12/2017 CLINICAL DATA:  Patient returns today to evaluate a possible left breast mass questioned on recent screening mammogram. EXAM: 2D DIGITAL DIAGNOSTIC LEFT MAMMOGRAM WITH CAD AND ADJUNCT TOMO ULTRASOUND LEFT BREAST COMPARISON:  Previous exams including recent screening mammogram dated 04/03/2017. ACR Breast Density Category c: The breast  tissue is heterogeneously dense, which may obscure small masses. FINDINGS: On today's additional views with spot compression and 3D tomosynthesis, an oval circumscribed mass is confirmed within the slightly outer left breast, 2-3 o'clock axis, at posterior depth, measuring approximately 5 mm. Additional partially obscured mass is seen within the upper-outer quadrant of the left breast, also at posterior depth, measuring approximately 7 mm greatest dimension. Mammographic images were processed with CAD. Targeted ultrasound is performed, showing multiple benign cysts within the left breast, including a benign cluster of cysts at the 1 o'clock axis, 7 cm from the nipple, measuring 8 mm, a likely correlate for the mammographic finding. A benign intramammary lymph node is seen in the left breast at the 2 o'clock axis, 8 cm from nipple, measuring 5 mm, also likely correlate for the mammographic finding. Scattered smaller cysts within the upper-outer quadrant correspond as incidental findings. No suspicious solid or cystic mass is identified within the upper-outer left breast. IMPRESSION: No evidence of malignancy. Multiple benign cysts within the upper-outer quadrant of the left breast, largest measuring 8 mm, corresponding to the mammographic findings. Patient may return to routine annual bilateral screening mammogram schedule. RECOMMENDATION: Screening mammogram in one year.(Code:SM-B-01Y) I have discussed the findings and recommendations with the patient. Results were also provided in writing at the conclusion of the visit. If applicable, a reminder letter will be sent to the patient regarding the next appointment. BI-RADS CATEGORY  2: Benign. Electronically Signed   By: Franki Cabot M.D.   On: 04/12/2017 14:58   Mm Diag Breast Tomo Uni Left  Result Date: 04/12/2017 CLINICAL DATA:  Patient returns today to evaluate a possible left breast mass questioned on recent screening mammogram. EXAM: 2D DIGITAL DIAGNOSTIC  LEFT MAMMOGRAM WITH CAD AND ADJUNCT TOMO ULTRASOUND LEFT BREAST COMPARISON:  Previous exams including recent screening mammogram dated 04/03/2017. ACR Breast Density Category c: The breast tissue is heterogeneously dense, which may obscure small masses. FINDINGS: On today's additional views with spot compression and 3D tomosynthesis, an oval circumscribed mass is confirmed within the slightly outer left breast, 2-3 o'clock axis, at posterior depth, measuring approximately 5 mm. Additional partially obscured mass is seen within the upper-outer quadrant of the left breast, also at posterior depth, measuring approximately 7 mm greatest dimension. Mammographic images were processed with CAD. Targeted ultrasound is performed, showing multiple benign cysts within the left breast, including a benign cluster of cysts at the 1 o'clock axis, 7 cm from the nipple, measuring 8 mm, a likely correlate for the mammographic finding. A benign intramammary lymph node is seen in the left breast at the 2 o'clock axis, 8 cm from nipple, measuring 5 mm, also likely correlate for the mammographic finding. Scattered smaller cysts within the upper-outer  quadrant correspond as incidental findings. No suspicious solid or cystic mass is identified within the upper-outer left breast. IMPRESSION: No evidence of malignancy. Multiple benign cysts within the upper-outer quadrant of the left breast, largest measuring 8 mm, corresponding to the mammographic findings. Patient may return to routine annual bilateral screening mammogram schedule. RECOMMENDATION: Screening mammogram in one year.(Code:SM-B-01Y) I have discussed the findings and recommendations with the patient. Results were also provided in writing at the conclusion of the visit. If applicable, a reminder letter will be sent to the patient regarding the next appointment. BI-RADS CATEGORY  2: Benign. Electronically Signed   By: Franki Cabot M.D.   On: 04/12/2017 14:58    Assessment &  Plan:   Waverly was seen today for annual exam.  Diagnoses and all orders for this visit:  Routine general medical examination at a health care facility- Exam completed, labs reviewed, vaccines reviewed, mammogram is up-to-date, patient education material was given. -     Lipid panel; Future -     HIV Antibody (routine testing w rflx); Future   I am having Juelz S. Kashani maintain her etonogestrel-ethinyl estradiol.  No orders of the defined types were placed in this encounter.    Follow-up: Return if symptoms worsen or fail to improve.  Scarlette Calico, MD

## 2018-08-09 NOTE — Patient Instructions (Signed)
Preventive Care 18-39 Years, Female Preventive care refers to lifestyle choices and visits with your health care provider that can promote health and wellness. What does preventive care include?   A yearly physical exam. This is also called an annual well check.  Dental exams once or twice a year.  Routine eye exams. Ask your health care provider how often you should have your eyes checked.  Personal lifestyle choices, including: ? Daily care of your teeth and gums. ? Regular physical activity. ? Eating a healthy diet. ? Avoiding tobacco and drug use. ? Limiting alcohol use. ? Practicing safe sex. ? Taking vitamin and mineral supplements as recommended by your health care provider. What happens during an annual well check? The services and screenings done by your health care provider during your annual well check will depend on your age, overall health, lifestyle risk factors, and family history of disease. Counseling Your health care provider may ask you questions about your:  Alcohol use.  Tobacco use.  Drug use.  Emotional well-being.  Home and relationship well-being.  Sexual activity.  Eating habits.  Work and work environment.  Method of birth control.  Menstrual cycle.  Pregnancy history. Screening You may have the following tests or measurements:  Height, weight, and BMI.  Diabetes screening. This is done by checking your blood sugar (glucose) after you have not eaten for a while (fasting).  Blood pressure.  Lipid and cholesterol levels. These may be checked every 5 years starting at age 20.  Skin check.  Hepatitis C blood test.  Hepatitis B blood test.  Sexually transmitted disease (STD) testing.  BRCA-related cancer screening. This may be done if you have a family history of breast, ovarian, tubal, or peritoneal cancers.  Pelvic exam and Pap test. This may be done every 3 years starting at age 21. Starting at age 30, this may be done every 5  years if you have a Pap test in combination with an HPV test. Discuss your test results, treatment options, and if necessary, the need for more tests with your health care provider. Vaccines Your health care provider may recommend certain vaccines, such as:  Influenza vaccine. This is recommended every year.  Tetanus, diphtheria, and acellular pertussis (Tdap, Td) vaccine. You may need a Td booster every 10 years.  Varicella vaccine. You may need this if you have not been vaccinated.  HPV vaccine. If you are 26 or younger, you may need three doses over 6 months.  Measles, mumps, and rubella (MMR) vaccine. You may need at least one dose of MMR. You may also need a second dose.  Pneumococcal 13-valent conjugate (PCV13) vaccine. You may need this if you have certain conditions and were not previously vaccinated.  Pneumococcal polysaccharide (PPSV23) vaccine. You may need one or two doses if you smoke cigarettes or if you have certain conditions.  Meningococcal vaccine. One dose is recommended if you are age 19-21 years and a first-year college student living in a residence hall, or if you have one of several medical conditions. You may also need additional booster doses.  Hepatitis A vaccine. You may need this if you have certain conditions or if you travel or work in places where you may be exposed to hepatitis A.  Hepatitis B vaccine. You may need this if you have certain conditions or if you travel or work in places where you may be exposed to hepatitis B.  Haemophilus influenzae type b (Hib) vaccine. You may need this if you   have certain risk factors. Talk to your health care provider about which screenings and vaccines you need and how often you need them. This information is not intended to replace advice given to you by your health care provider. Make sure you discuss any questions you have with your health care provider. Document Released: 07/12/2001 Document Revised: 12/27/2016  Document Reviewed: 03/17/2015 Elsevier Interactive Patient Education  2019 [employer].

## 2018-08-10 LAB — HIV ANTIBODY (ROUTINE TESTING W REFLEX): HIV 1&2 Ab, 4th Generation: NONREACTIVE

## 2018-08-12 NOTE — Assessment & Plan Note (Signed)
The 10-year ASCVD risk score Mikey Bussing DC Brooke Bonito., et al., 2013) is: 0.5%   Values used to calculate the score:     Age: 43 years     Sex: Female     Is Non-Hispanic African American: Yes     Diabetic: No     Tobacco smoker: Yes     Systolic Blood Pressure: 051 mmHg     Is BP treated: No     HDL Cholesterol: 79.8 mg/dL     Total Cholesterol: 203 mg/dL

## 2018-08-13 ENCOUNTER — Encounter: Payer: Self-pay | Admitting: Internal Medicine

## 2018-08-15 DIAGNOSIS — Z713 Dietary counseling and surveillance: Secondary | ICD-10-CM | POA: Diagnosis not present

## 2018-12-10 DIAGNOSIS — Z03818 Encounter for observation for suspected exposure to other biological agents ruled out: Secondary | ICD-10-CM | POA: Diagnosis not present

## 2019-05-30 DIAGNOSIS — Z113 Encounter for screening for infections with a predominantly sexual mode of transmission: Secondary | ICD-10-CM | POA: Diagnosis not present

## 2019-05-30 DIAGNOSIS — Z01419 Encounter for gynecological examination (general) (routine) without abnormal findings: Secondary | ICD-10-CM | POA: Diagnosis not present

## 2019-05-30 DIAGNOSIS — Z3202 Encounter for pregnancy test, result negative: Secondary | ICD-10-CM | POA: Diagnosis not present

## 2019-05-30 DIAGNOSIS — Z6829 Body mass index (BMI) 29.0-29.9, adult: Secondary | ICD-10-CM | POA: Diagnosis not present

## 2019-07-19 DIAGNOSIS — Z1231 Encounter for screening mammogram for malignant neoplasm of breast: Secondary | ICD-10-CM | POA: Diagnosis not present

## 2019-07-24 ENCOUNTER — Other Ambulatory Visit: Payer: Self-pay | Admitting: Obstetrics and Gynecology

## 2019-07-24 DIAGNOSIS — R928 Other abnormal and inconclusive findings on diagnostic imaging of breast: Secondary | ICD-10-CM

## 2019-07-26 ENCOUNTER — Other Ambulatory Visit: Payer: Self-pay | Admitting: Obstetrics and Gynecology

## 2019-07-26 ENCOUNTER — Ambulatory Visit
Admission: RE | Admit: 2019-07-26 | Discharge: 2019-07-26 | Disposition: A | Discharge status: Home / Self Care | Payer: BC Managed Care – PPO | Source: Ambulatory Visit | Attending: Obstetrics and Gynecology | Admitting: Obstetrics and Gynecology

## 2019-07-26 ENCOUNTER — Other Ambulatory Visit: Payer: Self-pay

## 2019-07-26 DIAGNOSIS — R928 Other abnormal and inconclusive findings on diagnostic imaging of breast: Secondary | ICD-10-CM

## 2019-07-26 DIAGNOSIS — N6324 Unspecified lump in the left breast, lower inner quadrant: Secondary | ICD-10-CM | POA: Diagnosis not present

## 2019-07-26 DIAGNOSIS — N6012 Diffuse cystic mastopathy of left breast: Secondary | ICD-10-CM | POA: Diagnosis not present

## 2019-07-26 DIAGNOSIS — R922 Inconclusive mammogram: Secondary | ICD-10-CM | POA: Diagnosis not present

## 2019-08-04 ENCOUNTER — Ambulatory Visit: Payer: BC Managed Care – PPO | Attending: Internal Medicine

## 2019-08-04 DIAGNOSIS — Z23 Encounter for immunization: Secondary | ICD-10-CM | POA: Insufficient documentation

## 2019-08-04 NOTE — Progress Notes (Signed)
   Covid-19 Vaccination Clinic  Name:  Stephanie Mendoza    MRN: [id number] DOB: May 14, 1976  08/04/2019  Stephanie Mendoza was observed post Covid-19 immunization for 15 minutes without incident. She was provided with Vaccine Information Sheet and instruction to access the V-Safe system.   Stephanie Mendoza was instructed to call 911 with any severe reactions post vaccine: Marland Kitchen Difficulty breathing  . Swelling of face and throat  . A fast heartbeat  . A bad rash all over body  . Dizziness and weakness   Immunizations Administered    Name Date Dose VIS Date Route   Pfizer COVID-19 Vaccine 08/04/2019  5:40 PM 0.3 mL 05/10/2019 Intramuscular   Manufacturer: [place]   Lot: [id number]   [place]: [address]-6

## 2019-09-03 ENCOUNTER — Ambulatory Visit: Payer: BC Managed Care – PPO | Attending: Internal Medicine

## 2019-09-03 DIAGNOSIS — Z23 Encounter for immunization: Secondary | ICD-10-CM

## 2019-09-03 NOTE — Progress Notes (Signed)
   Covid-19 Vaccination Clinic  Name:  ANGELINE TRICK    MRN: [id number] DOB: 05-15-1976  09/03/2019  Ms. Fails was observed post Covid-19 immunization for 15 minutes without incident. She was provided with Vaccine Information Sheet and instruction to access the V-Safe system.   Ms. Schoof was instructed to call 911 with any severe reactions post vaccine: Marland Kitchen Difficulty breathing  . Swelling of face and throat  . A fast heartbeat  . A bad rash all over body  . Dizziness and weakness   Immunizations Administered    Name Date Dose VIS Date Route   Pfizer COVID-19 Vaccine 09/03/2019  2:42 PM 0.3 mL 05/10/2019 Intramuscular   Manufacturer: [place]   Lot: [id number]   [place]: [address]-8

## 2020-01-02 DIAGNOSIS — Z20822 Contact with and (suspected) exposure to covid-19: Secondary | ICD-10-CM | POA: Diagnosis not present

## 2020-01-02 DIAGNOSIS — Z03818 Encounter for observation for suspected exposure to other biological agents ruled out: Secondary | ICD-10-CM | POA: Diagnosis not present

## 2020-04-28 ENCOUNTER — Other Ambulatory Visit: Payer: BC Managed Care – PPO

## 2020-04-28 DIAGNOSIS — Z20822 Contact with and (suspected) exposure to covid-19: Secondary | ICD-10-CM

## 2020-04-30 DIAGNOSIS — Z03818 Encounter for observation for suspected exposure to other biological agents ruled out: Secondary | ICD-10-CM | POA: Diagnosis not present

## 2020-04-30 DIAGNOSIS — Z20822 Contact with and (suspected) exposure to covid-19: Secondary | ICD-10-CM | POA: Diagnosis not present

## 2020-04-30 LAB — NOVEL CORONAVIRUS, NAA

## 2020-09-15 ENCOUNTER — Other Ambulatory Visit: Payer: Self-pay

## 2020-09-15 ENCOUNTER — Ambulatory Visit (INDEPENDENT_AMBULATORY_CARE_PROVIDER_SITE_OTHER): Payer: BC Managed Care – PPO | Admitting: Internal Medicine

## 2020-09-15 ENCOUNTER — Encounter: Payer: Self-pay | Admitting: Internal Medicine

## 2020-09-15 VITALS — BP 124/78 | HR 96 | Temp 98.6°F | Resp 16 | Ht 64.0 in | Wt 171.0 lb

## 2020-09-15 DIAGNOSIS — Z1159 Encounter for screening for other viral diseases: Secondary | ICD-10-CM | POA: Diagnosis not present

## 2020-09-15 DIAGNOSIS — Z124 Encounter for screening for malignant neoplasm of cervix: Secondary | ICD-10-CM | POA: Insufficient documentation

## 2020-09-15 DIAGNOSIS — Z1211 Encounter for screening for malignant neoplasm of colon: Secondary | ICD-10-CM

## 2020-09-15 DIAGNOSIS — Z01419 Encounter for gynecological examination (general) (routine) without abnormal findings: Secondary | ICD-10-CM | POA: Diagnosis not present

## 2020-09-15 DIAGNOSIS — Z0000 Encounter for general adult medical examination without abnormal findings: Secondary | ICD-10-CM

## 2020-09-15 DIAGNOSIS — Z1231 Encounter for screening mammogram for malignant neoplasm of breast: Secondary | ICD-10-CM | POA: Diagnosis not present

## 2020-09-15 DIAGNOSIS — Z6829 Body mass index (BMI) 29.0-29.9, adult: Secondary | ICD-10-CM | POA: Diagnosis not present

## 2020-09-15 LAB — LIPID PANEL
Cholesterol: 189 mg/dL (ref 0–200)
HDL: 77.8 mg/dL (ref >39.00)
LDL Cholesterol: 83 mg/dL (ref 0–99)
NonHDL: 110.84
Total CHOL/HDL Ratio: 2
Triglycerides: 140 mg/dL (ref 0.0–149.0)
VLDL: 28 mg/dL (ref 0.0–40.0)

## 2020-09-15 NOTE — Progress Notes (Signed)
Subjective:  Patient ID: Stephanie Mendoza, female    DOB: [birth year]  Age: 45 y.o. MRN: [id number]  CC: Annual Exam  This visit occurred during the SARS-CoV-2 public health emergency.  Safety protocols were in place, including screening questions prior to the visit, additional usage of staff PPE, and extensive cleaning of exam room while observing appropriate contact time as indicated for disinfecting solutions.    HPI Stephanie Mendoza presents for a CPX.  She feels well and offers no complaints.  History Stephanie Mendoza has a past medical history of Breast calcifications (09/2011).   She has a past surgical history that includes No past surgeries and Breast biopsy (10/31/2011).   Her family history includes Alcohol abuse in her mother; Arthritis in her maternal grandmother.She reports that she has been smoking. She has smoked for the past 10.00 years. She has never used smokeless tobacco. She reports current drug use. Drug: Marijuana. She reports that she does not drink alcohol.  Outpatient Medications Prior to Visit  Medication Sig Dispense Refill  . etonogestrel-ethinyl estradiol (NUVARING) 0.12-0.015 MG/24HR vaginal ring Insert vaginally and leave in place for 3 consecutive weeks, then remove for 1 week. 1 each 12   No facility-administered medications prior to visit.    ROS Review of Systems  Constitutional: Negative.  Negative for diaphoresis and fatigue.  HENT: Negative.   Eyes: Negative.   Respiratory: Negative for chest tightness and shortness of breath.   Cardiovascular: Negative for chest pain and leg swelling.  Gastrointestinal: Negative for abdominal pain, constipation, diarrhea and nausea.  Endocrine: Negative.   Genitourinary: Negative.  Negative for difficulty urinating.  Musculoskeletal: Negative for arthralgias and myalgias.  Skin: Negative.  Negative for color change.  Neurological: Negative.  Negative for dizziness, weakness, light-headedness and headaches.   Hematological: Negative for adenopathy. Does not bruise/bleed easily.  Psychiatric/Behavioral: Negative.     Objective:  BP 124/78   Pulse 96   Temp 98.6 F (37 C) (Oral)   Resp 16   Ht 5' 4"  (1.626 m)   Wt 171 lb (77.6 kg)   SpO2 96%   BMI 29.35 kg/m   Physical Exam Vitals reviewed.  Constitutional:      Appearance: Normal appearance.  HENT:     Nose: Nose normal.     Mouth/Throat:     Mouth: Mucous membranes are moist.  Eyes:     General: No scleral icterus.    Conjunctiva/sclera: Conjunctivae normal.  Cardiovascular:     Rate and Rhythm: Normal rate and regular rhythm.     Heart sounds: No murmur heard.   Pulmonary:     Effort: Pulmonary effort is normal.     Breath sounds: No stridor. No wheezing, rhonchi or rales.  Abdominal:     General: Abdomen is flat. Bowel sounds are normal. There is no distension.     Palpations: Abdomen is soft. There is no hepatomegaly, splenomegaly or mass.     Tenderness: There is no abdominal tenderness.  Musculoskeletal:        General: Normal range of motion.     Cervical back: Neck supple.     Right lower leg: No edema.     Left lower leg: No edema.  Lymphadenopathy:     Cervical: No cervical adenopathy.  Skin:    General: Skin is warm and dry.  Neurological:     General: No focal deficit present.     Mental Status: She is alert.  Psychiatric:  Mood and Affect: Mood normal.        Behavior: Behavior normal.     Lab Results  Component Value Date   WBC 8.2 09/14/2016   HGB 14.0 09/14/2016   HCT 43.2 09/14/2016   PLT 369.0 09/14/2016   GLUCOSE 99 09/14/2016   CHOL 189 09/15/2020   TRIG 140.0 09/15/2020   HDL 77.80 09/15/2020   LDLDIRECT 88.6 08/12/2011   LDLCALC 83 09/15/2020   ALT 12 09/14/2016   AST 16 09/14/2016   NA 137 09/14/2016   K 4.2 09/14/2016   CL 107 09/14/2016   CREATININE 0.94 09/14/2016   BUN 14 09/14/2016   CO2 24 09/14/2016   TSH 0.45 09/14/2015    Assessment & Plan:   Stephanie Mendoza  was seen today for annual exam.  Diagnoses and all orders for this visit:  Routine general medical examination at a health care facility- Exam completed, labs reviewed, vaccines reviewed, cancer screenings addressed, patient education material was given. -     Lipid panel; Future -     Hepatitis C antibody; Future -     HIV Antibody (routine testing w rflx); Future -     HIV Antibody (routine testing w rflx) -     Hepatitis C antibody -     Lipid panel  Cervical cancer screening -     Ambulatory referral to Gynecology  Colon cancer screening -     Cologuard   I am having Stephanie Mendoza maintain her etonogestrel-ethinyl estradiol.  No orders of the defined types were placed in this encounter.    Follow-up: Return in about 6 months (around 03/17/2021).  Scarlette Calico, MD

## 2020-09-15 NOTE — Patient Instructions (Signed)
Health Maintenance, Female Adopting a healthy lifestyle and getting preventive care are important in promoting health and wellness. Ask your health care provider about:  The right schedule for you to have regular tests and exams.  Things you can do on your own to prevent diseases and keep yourself healthy. What should I know about diet, weight, and exercise? Eat a healthy diet  Eat a diet that includes plenty of vegetables, fruits, low-fat dairy products, and lean protein.  Do not eat a lot of foods that are high in solid fats, added sugars, or sodium.   Maintain a healthy weight Body mass index (BMI) is used to identify weight problems. It estimates body fat based on height and weight. Your health care provider can help determine your BMI and help you achieve or maintain a healthy weight. Get regular exercise Get regular exercise. This is one of the most important things you can do for your health. Most adults should:  Exercise for at least 150 minutes each week. The exercise should increase your heart rate and make you sweat (moderate-intensity exercise).  Do strengthening exercises at least twice a week. This is in addition to the moderate-intensity exercise.  Spend less time sitting. Even light physical activity can be beneficial. Watch cholesterol and blood lipids Have your blood tested for lipids and cholesterol at 46 years of age, then have this test every 5 years. Have your cholesterol levels checked more often if:  Your lipid or cholesterol levels are high.  You are older than 46 years of age.  You are at high risk for heart disease. What should I know about cancer screening? Depending on your health history and family history, you may need to have cancer screening at various ages. This may include screening for:  Breast cancer.  Cervical cancer.  Colorectal cancer.  Skin cancer.  Lung cancer. What should I know about heart disease, diabetes, and high blood  pressure? Blood pressure and heart disease  High blood pressure causes heart disease and increases the risk of stroke. This is more likely to develop in people who have high blood pressure readings, are of African descent, or are overweight.  Have your blood pressure checked: ? Every 3-5 years if you are 18-39 years of age. ? Every year if you are 40 years old or older. Diabetes Have regular diabetes screenings. This checks your fasting blood sugar level. Have the screening done:  Once every three years after age 40 if you are at a normal weight and have a low risk for diabetes.  More often and at a younger age if you are overweight or have a high risk for diabetes. What should I know about preventing infection? Hepatitis B If you have a higher risk for hepatitis B, you should be screened for this virus. Talk with your health care provider to find out if you are at risk for hepatitis B infection. Hepatitis C Testing is recommended for:  Everyone born from 1945 through 1965.  Anyone with known risk factors for hepatitis C. Sexually transmitted infections (STIs)  Get screened for STIs, including gonorrhea and chlamydia, if: ? You are sexually active and are younger than 46 years of age. ? You are older than 46 years of age and your health care provider tells you that you are at risk for this type of infection. ? Your sexual activity has changed since you were last screened, and you are at increased risk for chlamydia or gonorrhea. Ask your health care provider   if you are at risk.  Ask your health care provider about whether you are at high risk for HIV. Your health care provider may recommend a prescription medicine to help prevent HIV infection. If you choose to take medicine to prevent HIV, you should first get tested for HIV. You should then be tested every 3 months for as long as you are taking the medicine. Pregnancy  If you are about to stop having your period (premenopausal) and  you may become pregnant, seek counseling before you get pregnant.  Take 400 to 800 micrograms (mcg) of folic acid every day if you become pregnant.  Ask for birth control (contraception) if you want to prevent pregnancy. Osteoporosis and menopause Osteoporosis is a disease in which the bones lose minerals and strength with aging. This can result in bone fractures. If you are 65 years old or older, or if you are at risk for osteoporosis and fractures, ask your health care provider if you should:  Be screened for bone loss.  Take a calcium or vitamin D supplement to lower your risk of fractures.  Be given hormone replacement therapy (HRT) to treat symptoms of menopause. Follow these instructions at home: Lifestyle  Do not use any products that contain nicotine or tobacco, such as cigarettes, e-cigarettes, and chewing tobacco. If you need help quitting, ask your health care provider.  Do not use street drugs.  Do not share needles.  Ask your health care provider for help if you need support or information about quitting drugs. Alcohol use  Do not drink alcohol if: ? Your health care provider tells you not to drink. ? You are pregnant, may be pregnant, or are planning to become pregnant.  If you drink alcohol: ? Limit how much you use to 0-1 drink a day. ? Limit intake if you are breastfeeding.  Be aware of how much alcohol is in your drink. In the [place] one drink equals one 12 oz bottle of beer (355 mL), one 5 oz glass of wine (148 mL), or one 1 oz glass of hard liquor (44 mL). General instructions  Schedule regular health, dental, and eye exams.  Stay current with your vaccines.  Tell your health care provider if: ? You often feel depressed. ? You have ever been abused or do not feel safe at home. Summary  Adopting a healthy lifestyle and getting preventive care are important in promoting health and wellness.  Follow your health care provider's instructions about healthy  diet, exercising, and getting tested or screened for diseases.  Follow your health care provider's instructions on monitoring your cholesterol and blood pressure. This information is not intended to replace advice given to you by your health care provider. Make sure you discuss any questions you have with your health care provider. Document Revised: 05/09/2018 Document Reviewed: 05/09/2018 Elsevier Patient Education  2021 [employer]vier Inc.  

## 2020-09-16 LAB — HEPATITIS C ANTIBODY
Hepatitis C Ab: NONREACTIVE
SIGNAL TO CUT-OFF: 0.01 (ref <1.00)

## 2020-09-16 LAB — HIV ANTIBODY (ROUTINE TESTING W REFLEX): HIV 1&2 Ab, 4th Generation: NONREACTIVE

## 2020-10-05 ENCOUNTER — Telehealth: Payer: Self-pay | Admitting: Internal Medicine

## 2020-10-05 DIAGNOSIS — M79644 Pain in right finger(s): Secondary | ICD-10-CM | POA: Diagnosis not present

## 2020-10-05 NOTE — Telephone Encounter (Signed)
Pt has been scheduled with Dr. Sharlet Salina for 5/10 @ 8.40am. PCP full.

## 2020-10-05 NOTE — Telephone Encounter (Signed)
    Seeking advice   Patient seeking advice for swollen thumb. Patient hit thumb over the weekend. Seeking advice to treat the swelling   Please advise

## 2020-10-06 ENCOUNTER — Ambulatory Visit: Payer: BC Managed Care – PPO | Admitting: Internal Medicine

## 2020-10-14 DIAGNOSIS — Z1211 Encounter for screening for malignant neoplasm of colon: Secondary | ICD-10-CM | POA: Diagnosis not present

## 2020-10-20 LAB — COLOGUARD: Cologuard: POSITIVE — AB

## 2020-10-22 ENCOUNTER — Telehealth: Payer: Self-pay | Admitting: Internal Medicine

## 2020-10-22 ENCOUNTER — Other Ambulatory Visit: Payer: Self-pay | Admitting: Internal Medicine

## 2020-10-22 DIAGNOSIS — R195 Other fecal abnormalities: Secondary | ICD-10-CM | POA: Insufficient documentation

## 2020-10-22 NOTE — Telephone Encounter (Signed)
Pt has been informed.

## 2020-10-22 NOTE — Telephone Encounter (Signed)
She has been referred to GI for a colonoscopy

## 2020-10-22 NOTE — Telephone Encounter (Signed)
Patient called and said that her cologurad results came back positive. She was wondering if there was anything that she needed to do. She can be reached at [phone]. Please advise

## 2020-11-03 ENCOUNTER — Encounter: Payer: Self-pay | Admitting: Gastroenterology

## 2020-11-17 ENCOUNTER — Ambulatory Visit (AMBULATORY_SURGERY_CENTER): Payer: BC Managed Care – PPO | Admitting: *Deleted

## 2020-11-17 ENCOUNTER — Other Ambulatory Visit: Payer: Self-pay

## 2020-11-17 VITALS — Ht 64.0 in | Wt 170.0 lb

## 2020-11-17 DIAGNOSIS — R195 Other fecal abnormalities: Secondary | ICD-10-CM

## 2020-11-17 MED ORDER — PEG-KCL-NACL-NASULF-NA ASC-C 100 G PO SOLR: 1.0000 | Freq: Once | ORAL | 0 refills | Status: AC

## 2020-11-17 NOTE — Progress Notes (Signed)
No egg or soy allergy known to patient  No issues with past sedation with any surgeries or procedures Patient denies ever being told they had issues or difficulty with intubation  No FH of Malignant Hyperthermia No diet pills per patient No home 02 use per patient  No blood thinners per patient  Pt denies issues with constipation  No A fib or A flutter  EMMI video to pt or via [place] 19 guidelines implemented in [place] today with Pt and RN  Pt is fully vaccinated  for Covid     Due to the COVID-19 pandemic we are asking patients to follow certain guidelines.  Pt aware of COVID protocols and [facility] guidelines   Pt verified name, DOB, address and insurance during PV today. Pt mailed instruction packet to included paper to complete and mail back to [facility] with addressed and stamped envelope, Emmi video, copy of consent form to read and not return, and instructions.   PV completed over the phone. Pt encouraged to call with questions or issues. My Chart instructions to pt as well

## 2020-11-23 ENCOUNTER — Telehealth: Payer: Self-pay | Admitting: Gastroenterology

## 2020-11-23 NOTE — Telephone Encounter (Signed)
Patient received Golytely from the pharmacy. Went over prep instructions. New prep instructions sent to [facility] and emailed to pt. Patient is aware.

## 2020-11-23 NOTE — Telephone Encounter (Signed)
Patient calling to ask how to take prep. Pt states she has prep;however everything is mixed in container. Pt state she didn't receive pouches to mix.. Plz advise

## 2020-11-25 ENCOUNTER — Encounter: Payer: Self-pay | Admitting: Gastroenterology

## 2020-12-01 ENCOUNTER — Other Ambulatory Visit: Payer: Self-pay

## 2020-12-01 ENCOUNTER — Ambulatory Visit (AMBULATORY_SURGERY_CENTER): Payer: BC Managed Care – PPO | Admitting: Gastroenterology

## 2020-12-01 ENCOUNTER — Encounter: Payer: Self-pay | Admitting: Gastroenterology

## 2020-12-01 VITALS — BP 127/78 | HR 68 | Temp 98.4°F | Resp 13 | Ht 64.0 in | Wt 170.0 lb

## 2020-12-01 DIAGNOSIS — K621 Rectal polyp: Secondary | ICD-10-CM | POA: Diagnosis not present

## 2020-12-01 DIAGNOSIS — R195 Other fecal abnormalities: Secondary | ICD-10-CM | POA: Diagnosis not present

## 2020-12-01 DIAGNOSIS — D128 Benign neoplasm of rectum: Secondary | ICD-10-CM

## 2020-12-01 MED ORDER — SODIUM CHLORIDE 0.9 % IV SOLN: 500.0000 mL | Freq: Once | INTRAVENOUS | Status: DC

## 2020-12-01 NOTE — Progress Notes (Signed)
Called to room to assist during endoscopic procedure.  Patient ID and intended procedure confirmed with present staff. Received instructions for my participation in the procedure from the performing physician.  

## 2020-12-01 NOTE — Patient Instructions (Signed)
YOU HAD AN ENDOSCOPIC PROCEDURE TODAY AT [facility]:   Refer to the procedure report that was given to you for any specific questions about what was found during the examination.  If the procedure report does not answer your questions, please call your gastroenterologist to clarify.  If you requested that your care partner not be given the details of your procedure findings, then the procedure report has been included in a sealed envelope for you to review at your convenience later.  YOU SHOULD EXPECT: Some feelings of bloating in the abdomen. Passage of more gas than usual.  Walking can help get rid of the air that was put into your GI tract during the procedure and reduce the bloating. If you had a lower endoscopy (such as a colonoscopy or flexible sigmoidoscopy) you may notice spotting of blood in your stool or on the toilet paper. If you underwent a bowel prep for your procedure, you may not have a normal bowel movement for a few days.  Please Note:  You might notice some irritation and congestion in your nose or some drainage.  This is from the oxygen used during your procedure.  There is no need for concern and it should clear up in a day or so.  SYMPTOMS TO REPORT IMMEDIATELY:  Following lower endoscopy (colonoscopy or flexible sigmoidoscopy):  Excessive amounts of blood in the stool  Significant tenderness or worsening of abdominal pains  Swelling of the abdomen that is new, acute  Fever of 100F or higher    For urgent or emergent issues, a gastroenterologist can be reached at any hour by calling [phone]. Do not use MyChart messaging for urgent concerns.    DIET:  We do recommend a small meal at first, but then you may proceed to your regular diet.  Drink plenty of fluids but you should avoid alcoholic beverages for 24 hours.  ACTIVITY:  You should plan to take it easy for the rest of today and you should NOT DRIVE or use heavy machinery until tomorrow (because  of the sedation medicines used during the test).    FOLLOW UP: Our staff will call the number listed on your records 48-72 hours following your procedure to check on you and address any questions or concerns that you may have regarding the information given to you following your procedure. If we do not reach you, we will leave a message.  We will attempt to reach you two times.  During this call, we will ask if you have developed any symptoms of COVID 19. If you develop any symptoms (ie: fever, flu-like symptoms, shortness of breath, cough etc.) before then, please call [phone].  If you test positive for Covid 19 in the 2 weeks post procedure, please call and report this information to [place].    If any biopsies were taken you will be contacted by phone or by letter within the next 1-3 weeks.  Please call us at [phone] if you have not heard about the biopsies in 3 weeks.    SIGNATURES/CONFIDENTIALITY: You and/or your care partner have signed paperwork which will be entered into your electronic medical record.  These signatures attest to the fact that that the information above on your After Visit Summary has been reviewed and is understood.  Full responsibility of the confidentiality of this discharge information lies with you and/or your care-partner.    Resume medications. Information given on polyps.

## 2020-12-01 NOTE — Op Note (Signed)
[place] Patient Name: Stephanie Mendoza Procedure Date: 12/01/2020 9:31 AM MRN: [id number] Endoscopist: Thornton Park MD, MD Age: 45 Referring MD:  Date of Birth: 02/26/1976 Gender: Female Account #: [id number] Procedure:                Colonoscopy Indications:              Positive Cologuard test                           No known family history of colon cancer or polyps Medicines:                Monitored Anesthesia Care Procedure:                Pre-Anesthesia Assessment:                           - Prior to the procedure, a History and Physical                            was performed, and patient medications and                            allergies were reviewed. The patient's tolerance of                            previous anesthesia was also reviewed. The risks                            and benefits of the procedure and the sedation                            options and risks were discussed with the patient.                            All questions were answered, and informed consent                            was obtained. Prior Anticoagulants: The patient has                            taken no previous anticoagulant or antiplatelet                            agents. ASA Grade Assessment: II - A patient with                            mild systemic disease. After reviewing the risks                            and benefits, the patient was deemed in                            satisfactory condition to undergo the procedure.  After obtaining informed consent, the colonoscope                            was passed under direct vision. Throughout the                            procedure, the patient's blood pressure, pulse, and                            oxygen saturations were monitored continuously. The                            CF [id number] #[id number] was introduced through the anus                            and advanced to the 3 cm into the  ileum. A second                            forward view of the right colon was performed. The                            colonoscopy was performed without difficulty. The                            patient tolerated the procedure well. The quality                            of the bowel preparation was good. The terminal                            ileum, ileocecal valve, appendiceal orifice, and                            rectum were photographed. Scope In: 9:39:03 AM Scope Out: 9:57:44 AM Scope Withdrawal Time: 0 hours 14 minutes 42 seconds  Total Procedure Duration: 0 hours 18 minutes 41 seconds  Findings:                 The perianal and digital rectal examinations were                            normal.                           Four sessile polyps were found in the rectum. The                            polyps were 1 to 3 mm in size. These polyps were                            removed with a cold snare. Resection and retrieval                            were complete. Estimated  blood loss was minimal.                           The exam was otherwise without abnormality on                            direct and retroflexion views. Complications:            No immediate complications. Estimated blood loss:                            Minimal. Estimated Blood Loss:     Estimated blood loss was minimal. Impression:               - Four 1 to 3 mm polyps in the rectum, removed with                            a cold snare. Resected and retrieved.                           - The examination was otherwise normal on direct                            and retroflexion views. Recommendation:           - Patient has a contact number available for                            emergencies. The signs and symptoms of potential                            delayed complications were discussed with the                            patient. Return to normal activities tomorrow.                             Written discharge instructions were provided to the                            patient.                           - Resume previous diet.                           - Continue present medications.                           - Await pathology results.                           - Repeat colonoscopy date to be determined after                            pending pathology results are reviewed for  surveillance.                           - Emerging evidence supports eating a diet of                            fruits, vegetables, grains, calcium, and yogurt                            while reducing red meat and alcohol may reduce the                            risk of colon cancer.                           - Thank you for allowing me to be involved in your                            colon cancer prevention. Thornton Park MD, MD 12/01/2020 10:01:46 AM This report has been signed electronically.

## 2020-12-01 NOTE — Progress Notes (Signed)
Pt's states no medical or surgical changes since previsit or office visit. 

## 2020-12-01 NOTE — Progress Notes (Signed)
pt tolerated well. VSS. awake and to recovery. Report given to RN.  

## 2020-12-03 ENCOUNTER — Telehealth: Payer: Self-pay | Admitting: *Deleted

## 2020-12-03 NOTE — Telephone Encounter (Signed)
  Follow up Call-  Call back number 12/01/2020  Post procedure Call Back phone  # [phone]  Permission to leave phone message Yes  Some recent data might be hidden     Patient questions:  Do you have a fever, pain , or abdominal swelling? No. Pain Score  0 *  Have you tolerated food without any problems? Yes.    Have you been able to return to your normal activities? Yes.    Do you have any questions about your discharge instructions: Diet   No. Medications  No. Follow up visit  No.  Do you have questions or concerns about your Care? No.  Actions: * If pain score is 4 or above: No action needed, pain <4.  Have you developed a fever since your procedure? no  2.   Have you had an respiratory symptoms (SOB or cough) since your procedure? no  3.   Have you tested positive for COVID 19 since your procedure no  4.   Have you had any family members/close contacts diagnosed with the COVID 19 since your procedure?  no   If yes to any of these questions please route to Joylene John, RN and Joella Prince, RN

## 2020-12-10 ENCOUNTER — Encounter: Payer: Self-pay | Admitting: Gastroenterology

## 2021-11-15 DIAGNOSIS — Z6833 Body mass index (BMI) 33.0-33.9, adult: Secondary | ICD-10-CM | POA: Diagnosis not present

## 2021-11-15 DIAGNOSIS — Z1231 Encounter for screening mammogram for malignant neoplasm of breast: Secondary | ICD-10-CM | POA: Diagnosis not present

## 2021-11-15 DIAGNOSIS — Z01419 Encounter for gynecological examination (general) (routine) without abnormal findings: Secondary | ICD-10-CM | POA: Diagnosis not present

## 2021-11-17 ENCOUNTER — Other Ambulatory Visit: Payer: Self-pay | Admitting: Obstetrics and Gynecology

## 2021-11-17 DIAGNOSIS — R928 Other abnormal and inconclusive findings on diagnostic imaging of breast: Secondary | ICD-10-CM

## 2021-11-23 ENCOUNTER — Ambulatory Visit
Admission: RE | Admit: 2021-11-23 | Discharge: 2021-11-23 | Disposition: A | Discharge status: Home / Self Care | Payer: BC Managed Care – PPO | Source: Ambulatory Visit | Attending: Obstetrics and Gynecology | Admitting: Obstetrics and Gynecology

## 2021-11-23 DIAGNOSIS — R928 Other abnormal and inconclusive findings on diagnostic imaging of breast: Secondary | ICD-10-CM

## 2021-11-23 DIAGNOSIS — R922 Inconclusive mammogram: Secondary | ICD-10-CM | POA: Diagnosis not present

## 2021-11-23 DIAGNOSIS — N6001 Solitary cyst of right breast: Secondary | ICD-10-CM | POA: Diagnosis not present

## 2021-11-23 DIAGNOSIS — N6002 Solitary cyst of left breast: Secondary | ICD-10-CM | POA: Diagnosis not present

## 2021-11-23 LAB — HM PAP SMEAR

## 2022-03-23 ENCOUNTER — Encounter: Payer: Self-pay | Admitting: Internal Medicine

## 2022-03-23 ENCOUNTER — Ambulatory Visit (INDEPENDENT_AMBULATORY_CARE_PROVIDER_SITE_OTHER): Payer: BC Managed Care – PPO | Admitting: Internal Medicine

## 2022-03-23 VITALS — BP 138/86 | HR 74 | Temp 98.8°F | Resp 16 | Ht 64.0 in | Wt 189.0 lb

## 2022-03-23 DIAGNOSIS — R030 Elevated blood-pressure reading, without diagnosis of hypertension: Secondary | ICD-10-CM | POA: Diagnosis not present

## 2022-03-23 DIAGNOSIS — Z6832 Body mass index (BMI) 32.0-32.9, adult: Secondary | ICD-10-CM

## 2022-03-23 DIAGNOSIS — Z0000 Encounter for general adult medical examination without abnormal findings: Secondary | ICD-10-CM | POA: Diagnosis not present

## 2022-03-23 DIAGNOSIS — E118 Type 2 diabetes mellitus with unspecified complications: Secondary | ICD-10-CM | POA: Diagnosis not present

## 2022-03-23 DIAGNOSIS — Z23 Encounter for immunization: Secondary | ICD-10-CM | POA: Diagnosis not present

## 2022-03-23 DIAGNOSIS — F5104 Psychophysiologic insomnia: Secondary | ICD-10-CM | POA: Insufficient documentation

## 2022-03-23 DIAGNOSIS — E6609 Other obesity due to excess calories: Secondary | ICD-10-CM | POA: Diagnosis not present

## 2022-03-23 LAB — BASIC METABOLIC PANEL
BUN: 13 mg/dL (ref 6–23)
CO2: 26 mEq/L (ref 19–32)
Calcium: 9.3 mg/dL (ref 8.4–10.5)
Chloride: 103 mEq/L (ref 96–112)
Creatinine, Ser: 0.8 mg/dL (ref 0.40–1.20)
GFR: 87.7 mL/min (ref >60.00)
Glucose, Bld: 97 mg/dL (ref 70–99)
Potassium: 3.9 mEq/L (ref 3.5–5.1)
Sodium: 137 mEq/L (ref 135–145)

## 2022-03-23 LAB — CBC WITH DIFFERENTIAL/PLATELET
Basophils Absolute: 0 10*3/uL (ref 0.0–0.1)
Basophils Relative: 0.4 % (ref 0.0–3.0)
Eosinophils Absolute: 0.1 10*3/uL (ref 0.0–0.7)
Eosinophils Relative: 1 % (ref 0.0–5.0)
HCT: 40.9 % (ref 36.0–46.0)
Hemoglobin: 12.9 g/dL (ref 12.0–15.0)
Lymphocytes Relative: 33.9 % (ref 12.0–46.0)
Lymphs Abs: 2.8 10*3/uL (ref 0.7–4.0)
MCHC: 31.5 g/dL (ref 30.0–36.0)
MCV: 72.4 fl — ABNORMAL LOW (ref 78.0–100.0)
Monocytes Absolute: 0.7 10*3/uL (ref 0.1–1.0)
Monocytes Relative: 8.5 % (ref 3.0–12.0)
Neutro Abs: 4.7 10*3/uL (ref 1.4–7.7)
Neutrophils Relative %: 56.2 % (ref 43.0–77.0)
Platelets: 342 10*3/uL (ref 150.0–400.0)
RBC: 5.65 Mil/uL — ABNORMAL HIGH (ref 3.87–5.11)
RDW: 15 % (ref 11.5–15.5)
WBC: 8.4 10*3/uL (ref 4.0–10.5)

## 2022-03-23 LAB — URINALYSIS, ROUTINE W REFLEX MICROSCOPIC
Bilirubin Urine: NEGATIVE
Ketones, ur: NEGATIVE
Nitrite: NEGATIVE
Specific Gravity, Urine: 1.025 (ref 1.000–1.030)
Total Protein, Urine: NEGATIVE
Urine Glucose: NEGATIVE
Urobilinogen, UA: 0.2 (ref 0.0–1.0)
pH: 6 (ref 5.0–8.0)

## 2022-03-23 LAB — HEPATIC FUNCTION PANEL
ALT: 13 U/L (ref 0–35)
AST: 17 U/L (ref 0–37)
Albumin: 3.9 g/dL (ref 3.5–5.2)
Alkaline Phosphatase: 62 U/L (ref 39–117)
Bilirubin, Direct: 0.1 mg/dL (ref 0.0–0.3)
Total Bilirubin: 0.6 mg/dL (ref 0.2–1.2)
Total Protein: 7.3 g/dL (ref 6.0–8.3)

## 2022-03-23 LAB — HEMOGLOBIN A1C: Hgb A1c MFr Bld: 6.5 % (ref 4.6–6.5)

## 2022-03-23 LAB — TSH: TSH: 0.61 u[IU]/mL (ref 0.35–5.50)

## 2022-03-23 MED ORDER — ESZOPICLONE 3 MG PO TABS: 3.0000 mg | ORAL_TABLET | Freq: Every day | ORAL | 1 refills | Status: DC

## 2022-03-23 NOTE — Progress Notes (Signed)
Subjective:  Patient ID: Stephanie Mendoza, female    DOB: 15-Feb-1976  Age: 46 y.o. MRN: [id number]  CC: Annual Exam and Hypertension   HPI Pernell S Konigsberg presents for a CPX and f/up -   She is an active walker and has good endurance.  She denies chest pain, shortness of breath, diaphoresis, or edema.  She complains of insomnia and has not gotten much symptom relief with melatonin.  She complains of weight gain and chronic fatigue.  Outpatient Medications Prior to Visit  Medication Sig Dispense Refill   BIOTIN PO Take by mouth. gummies     etonogestrel-ethinyl estradiol (NUVARING) 0.12-0.015 MG/24HR vaginal ring Insert vaginally and leave in place for 3 consecutive weeks, then remove for 1 week. 1 each 12   MELATONIN PO Take by mouth.     No facility-administered medications prior to visit.    ROS Review of Systems  Constitutional:  Positive for fatigue and unexpected weight change. Negative for appetite change, chills, diaphoresis and fever.  HENT: Negative.    Eyes: Negative.   Respiratory:  Negative for apnea, cough, chest tightness, shortness of breath and wheezing.   Cardiovascular:  Negative for chest pain, palpitations and leg swelling.  Gastrointestinal:  Negative for abdominal pain, constipation, diarrhea, nausea and vomiting.  Endocrine: Negative.   Genitourinary: Negative.  Negative for difficulty urinating.  Musculoskeletal: Negative.   Skin: Negative.   Neurological: Negative.  Negative for dizziness, weakness, light-headedness and headaches.  Hematological:  Negative for adenopathy. Does not bruise/bleed easily.  Psychiatric/Behavioral:  Positive for sleep disturbance. Negative for decreased concentration. The patient is not nervous/anxious.     Objective:  BP 138/86 (BP Location: Left Arm, Patient Position: Sitting, Cuff Size: Large)   Pulse 74   Temp 98.8 F (37.1 C) (Oral)   Resp 16   Ht '5\' 4"'$  (1.626 m)   Wt 189 lb (85.7 kg)   LMP 03/16/2022 (Exact  Date) Comment: IUD  SpO2 96%   BMI 32.44 kg/m   BP Readings from Last 3 Encounters:  03/23/22 138/86  12/01/20 127/78  09/15/20 124/78    Wt Readings from Last 3 Encounters:  03/23/22 189 lb (85.7 kg)  12/01/20 170 lb (77.1 kg)  11/17/20 170 lb (77.1 kg)    Physical Exam Vitals reviewed.  Constitutional:      Appearance: Normal appearance.  HENT:     Nose: Nose normal.     Mouth/Throat:     Mouth: Mucous membranes are moist.  Eyes:     General: No scleral icterus.    Conjunctiva/sclera: Conjunctivae normal.  Cardiovascular:     Rate and Rhythm: Normal rate and regular rhythm.     Heart sounds: No murmur heard. Pulmonary:     Effort: Pulmonary effort is normal.     Breath sounds: No stridor. No wheezing, rhonchi or rales.  Abdominal:     General: Abdomen is flat.     Palpations: There is no mass.     Tenderness: There is no abdominal tenderness. There is no guarding or rebound.     Hernia: No hernia is present.  Musculoskeletal:        General: Normal range of motion.     Cervical back: Neck supple.     Right lower leg: No edema.     Left lower leg: No edema.  Lymphadenopathy:     Cervical: No cervical adenopathy.  Skin:    General: Skin is warm and dry.  Neurological:  General: No focal deficit present.     Mental Status: She is alert. Mental status is at baseline.  Psychiatric:        Mood and Affect: Mood normal.        Behavior: Behavior normal.     Lab Results  Component Value Date   WBC 8.4 03/23/2022   HGB 12.9 03/23/2022   HCT 40.9 03/23/2022   PLT 342.0 03/23/2022   GLUCOSE 97 03/23/2022   CHOL 189 09/15/2020   TRIG 140.0 09/15/2020   HDL 77.80 09/15/2020   LDLDIRECT 88.6 08/12/2011   LDLCALC 83 09/15/2020   ALT 13 03/23/2022   AST 17 03/23/2022   NA 137 03/23/2022   K 3.9 03/23/2022   CL 103 03/23/2022   CREATININE 0.80 03/23/2022   BUN 13 03/23/2022   CO2 26 03/23/2022   TSH 0.61 03/23/2022   HGBA1C 6.5 03/23/2022    MM  DIAG BREAST TOMO BILATERAL  Result Date: 11/23/2021 CLINICAL DATA:  Patient returns today to evaluate possible bilateral breast asymmetries identified on a recent screening mammogram. EXAM: DIGITAL DIAGNOSTIC BILATERAL MAMMOGRAM WITH TOMOSYNTHESIS AND CAD; ULTRASOUND LEFT BREAST LIMITED; ULTRASOUND RIGHT BREAST LIMITED TECHNIQUE: Bilateral digital diagnostic mammography and breast tomosynthesis was performed. The images were evaluated with computer-aided detection.; Targeted ultrasound examination of the left breast was performed.; Targeted ultrasound examination of the right breast was performed COMPARISON:  Previous exams including recent screening mammogram dated 11/15/2021. ACR Breast Density Category c: The breast tissue is heterogeneously dense, which may obscure small masses. FINDINGS: On today's additional diagnostic views, partially obscured masses are confirmed within the upper RIGHT breast, 11-1 o'clock axis region, and lower LEFT breast, 5-6 o'clock axis region, both measuring approximately 7 mm greatest dimension. RIGHT breast: Targeted ultrasound is performed, showing a benign cyst in the RIGHT breast at the 11 o'clock axis, 1 cm from the nipple, measuring 6 mm, corresponding to the mammographic finding. Additional smaller cysts are seen within the upper-outer quadrant of the RIGHT breast, corresponding as incidental findings. LEFT breast: Targeted ultrasound is performed, showing a benign cyst in the LEFT breast at the 6 o'clock axis, 2 cm from the nipple, measuring 6 mm, corresponding to the mammographic finding. An additional smaller cyst is seen within the adjacent LEFT breast at the 6 o'clock axis, corresponding as an incidental finding. IMPRESSION: No evidence of malignancy within either breast. Small benign cysts within each breast, largest measuring 6 mm, corresponding to the mammographic findings. Patient may return to routine annual bilateral screening mammogram schedule. RECOMMENDATION:  Screening mammogram in one year.(Code:SM-B-01Y) I have discussed the findings and recommendations with the patient. If applicable, a reminder letter will be sent to the patient regarding the next appointment. BI-RADS CATEGORY  2: Benign. Electronically Signed   By: Franki Cabot M.D.   On: 11/23/2021 14:17  US BREAST LTD UNI LEFT INC AXILLA  Result Date: 11/23/2021 CLINICAL DATA:  Patient returns today to evaluate possible bilateral breast asymmetries identified on a recent screening mammogram. EXAM: DIGITAL DIAGNOSTIC BILATERAL MAMMOGRAM WITH TOMOSYNTHESIS AND CAD; ULTRASOUND LEFT BREAST LIMITED; ULTRASOUND RIGHT BREAST LIMITED TECHNIQUE: Bilateral digital diagnostic mammography and breast tomosynthesis was performed. The images were evaluated with computer-aided detection.; Targeted ultrasound examination of the left breast was performed.; Targeted ultrasound examination of the right breast was performed COMPARISON:  Previous exams including recent screening mammogram dated 11/15/2021. ACR Breast Density Category c: The breast tissue is heterogeneously dense, which may obscure small masses. FINDINGS: On today's additional diagnostic views, partially obscured masses  are confirmed within the upper RIGHT breast, 11-1 o'clock axis region, and lower LEFT breast, 5-6 o'clock axis region, both measuring approximately 7 mm greatest dimension. RIGHT breast: Targeted ultrasound is performed, showing a benign cyst in the RIGHT breast at the 11 o'clock axis, 1 cm from the nipple, measuring 6 mm, corresponding to the mammographic finding. Additional smaller cysts are seen within the upper-outer quadrant of the RIGHT breast, corresponding as incidental findings. LEFT breast: Targeted ultrasound is performed, showing a benign cyst in the LEFT breast at the 6 o'clock axis, 2 cm from the nipple, measuring 6 mm, corresponding to the mammographic finding. An additional smaller cyst is seen within the adjacent LEFT breast at the  6 o'clock axis, corresponding as an incidental finding. IMPRESSION: No evidence of malignancy within either breast. Small benign cysts within each breast, largest measuring 6 mm, corresponding to the mammographic findings. Patient may return to routine annual bilateral screening mammogram schedule. RECOMMENDATION: Screening mammogram in one year.(Code:SM-B-01Y) I have discussed the findings and recommendations with the patient. If applicable, a reminder letter will be sent to the patient regarding the next appointment. BI-RADS CATEGORY  2: Benign. Electronically Signed   By: Franki Cabot M.D.   On: 11/23/2021 14:17  US BREAST LTD UNI RIGHT INC AXILLA  Result Date: 11/23/2021 CLINICAL DATA:  Patient returns today to evaluate possible bilateral breast asymmetries identified on a recent screening mammogram. EXAM: DIGITAL DIAGNOSTIC BILATERAL MAMMOGRAM WITH TOMOSYNTHESIS AND CAD; ULTRASOUND LEFT BREAST LIMITED; ULTRASOUND RIGHT BREAST LIMITED TECHNIQUE: Bilateral digital diagnostic mammography and breast tomosynthesis was performed. The images were evaluated with computer-aided detection.; Targeted ultrasound examination of the left breast was performed.; Targeted ultrasound examination of the right breast was performed COMPARISON:  Previous exams including recent screening mammogram dated 11/15/2021. ACR Breast Density Category c: The breast tissue is heterogeneously dense, which may obscure small masses. FINDINGS: On today's additional diagnostic views, partially obscured masses are confirmed within the upper RIGHT breast, 11-1 o'clock axis region, and lower LEFT breast, 5-6 o'clock axis region, both measuring approximately 7 mm greatest dimension. RIGHT breast: Targeted ultrasound is performed, showing a benign cyst in the RIGHT breast at the 11 o'clock axis, 1 cm from the nipple, measuring 6 mm, corresponding to the mammographic finding. Additional smaller cysts are seen within the upper-outer quadrant of the  RIGHT breast, corresponding as incidental findings. LEFT breast: Targeted ultrasound is performed, showing a benign cyst in the LEFT breast at the 6 o'clock axis, 2 cm from the nipple, measuring 6 mm, corresponding to the mammographic finding. An additional smaller cyst is seen within the adjacent LEFT breast at the 6 o'clock axis, corresponding as an incidental finding. IMPRESSION: No evidence of malignancy within either breast. Small benign cysts within each breast, largest measuring 6 mm, corresponding to the mammographic findings. Patient may return to routine annual bilateral screening mammogram schedule. RECOMMENDATION: Screening mammogram in one year.(Code:SM-B-01Y) I have discussed the findings and recommendations with the patient. If applicable, a reminder letter will be sent to the patient regarding the next appointment. BI-RADS CATEGORY  2: Benign. Electronically Signed   By: Franki Cabot M.D.   On: 11/23/2021 14:17   Assessment & Plan:   Chessa was seen today for annual exam and hypertension.  Diagnoses and all orders for this visit:  Class 1 obesity due to excess calories with serious comorbidity and body mass index (BMI) of 32.0 to 32.9 in adult- Her labs are negative for secondary causes.  She has developed DM 2. -  Basic metabolic panel; Future -     TSH; Future -     Hepatic function panel; Future -     Hemoglobin A1c; Future -     CBC with Differential/Platelet; Future -     CBC with Differential/Platelet -     Hemoglobin A1c -     Hepatic function panel -     TSH -     Basic metabolic panel  Elevated blood-pressure reading without diagnosis of hypertension- Her labs are negative for secondary causes.  I have asked her to stop taking melatonin and to improve her lifestyle modifications. -     Basic metabolic panel; Future -     TSH; Future -     Hepatic function panel; Future -     Urinalysis, Routine w reflex microscopic; Future -     CBC with Differential/Platelet;  Future -     CBC with Differential/Platelet -     Urinalysis, Routine w reflex microscopic -     Hepatic function panel -     TSH -     Basic metabolic panel  Psychophysiological insomnia -     Eszopiclone 3 MG TABS; Take 1 tablet (3 mg total) by mouth at bedtime. Take immediately before bedtime  Routine general medical examination at a health care facility- Exam completed, labs reviewed, vaccines reviewed and updated, cancer screenings are up-to-date, patient education was given.  Type II diabetes mellitus with manifestations (Joyce)- Her A1c is 6.5%.  Medical therapy is not yet indicated.  Other orders -     Tdap vaccine greater than or equal to 7yo IM   I have discontinued Elora S. Magadan's MELATONIN PO. I am also having her start on Eszopiclone. Additionally, I am having her maintain her etonogestrel-ethinyl estradiol and BIOTIN PO.  Meds ordered this encounter  Medications   Eszopiclone 3 MG TABS    Sig: Take 1 tablet (3 mg total) by mouth at bedtime. Take immediately before bedtime    Dispense:  90 tablet    Refill:  1     Follow-up: Return in about 6 months (around 09/22/2022).  Scarlette Calico, MD

## 2022-03-23 NOTE — Patient Instructions (Signed)
Hypertension, Adult High blood pressure (hypertension) is when the force of blood pumping through the arteries is too strong. The arteries are the blood vessels that carry blood from the heart throughout the body. Hypertension forces the heart to work harder to pump blood and may cause arteries to become narrow or stiff. Untreated or uncontrolled hypertension can lead to a heart attack, heart failure, a stroke, kidney disease, and other problems. A blood pressure reading consists of a higher number over a lower number. Ideally, your blood pressure should be below 120/80. The first ("top") number is called the systolic pressure. It is a measure of the pressure in your arteries as your heart beats. The second ("bottom") number is called the diastolic pressure. It is a measure of the pressure in your arteries as the heart relaxes. What are the causes? The exact cause of this condition is not known. There are some conditions that result in high blood pressure. What increases the risk? Certain factors may make you more likely to develop high blood pressure. Some of these risk factors are under your control, including: Smoking. Not getting enough exercise or physical activity. Being overweight. Having too much fat, sugar, calories, or salt (sodium) in your diet. Drinking too much alcohol. Other risk factors include: Having a personal history of heart disease, diabetes, high cholesterol, or kidney disease. Stress. Having a family history of high blood pressure and high cholesterol. Having obstructive sleep apnea. Age. The risk increases with age. What are the signs or symptoms? High blood pressure may not cause symptoms. Very high blood pressure (hypertensive crisis) may cause: Headache. Fast or irregular heartbeats (palpitations). Shortness of breath. Nosebleed. Nausea and vomiting. Vision changes. Severe chest pain, dizziness, and seizures. How is this diagnosed? This condition is diagnosed by  measuring your blood pressure while you are seated, with your arm resting on a flat surface, your legs uncrossed, and your feet flat on the floor. The cuff of the blood pressure monitor will be placed directly against the skin of your upper arm at the level of your heart. Blood pressure should be measured at least twice using the same arm. Certain conditions can cause a difference in blood pressure between your right and left arms. If you have a high blood pressure reading during one visit or you have normal blood pressure with other risk factors, you may be asked to: Return on a different day to have your blood pressure checked again. Monitor your blood pressure at home for 1 week or longer. If you are diagnosed with hypertension, you may have other blood or imaging tests to help your health care provider understand your overall risk for other conditions. How is this treated? This condition is treated by making healthy lifestyle changes, such as eating healthy foods, exercising more, and reducing your alcohol intake. You may be referred for counseling on a healthy diet and physical activity. Your health care provider may prescribe medicine if lifestyle changes are not enough to get your blood pressure under control and if: Your systolic blood pressure is above 130. Your diastolic blood pressure is above 80. Your personal target blood pressure may vary depending on your medical conditions, your age, and other factors. Follow these instructions at home: Eating and drinking  Eat a diet that is high in fiber and potassium, and low in sodium, added sugar, and fat. An example of this eating plan is called the DASH diet. DASH stands for Dietary Approaches to Stop Hypertension. To eat this way: Eat   plenty of fresh fruits and vegetables. Try to fill one half of your plate at each meal with fruits and vegetables. Eat whole grains, such as whole-wheat pasta, brown rice, or whole-grain bread. Fill about one  fourth of your plate with whole grains. Eat or drink low-fat dairy products, such as skim milk or low-fat yogurt. Avoid fatty cuts of meat, processed or cured meats, and poultry with skin. Fill about one fourth of your plate with lean proteins, such as fish, chicken without skin, beans, eggs, or tofu. Avoid pre-made and processed foods. These tend to be higher in sodium, added sugar, and fat. Reduce your daily sodium intake. Many people with hypertension should eat less than 1,500 mg of sodium a day. Do not drink alcohol if: Your health care provider tells you not to drink. You are pregnant, may be pregnant, or are planning to become pregnant. If you drink alcohol: Limit how much you have to: 0-1 drink a day for women. 0-2 drinks a day for men. Know how much alcohol is in your drink. In the [place] one drink equals one 12 oz bottle of beer (355 mL), one 5 oz glass of wine (148 mL), or one 1 oz glass of hard liquor (44 mL). Lifestyle  Work with your health care provider to maintain a healthy body weight or to lose weight. Ask what an ideal weight is for you. Get at least 30 minutes of exercise that causes your heart to beat faster (aerobic exercise) most days of the week. Activities may include walking, swimming, or biking. Include exercise to strengthen your muscles (resistance exercise), such as Pilates or lifting weights, as part of your weekly exercise routine. Try to do these types of exercises for 30 minutes at least 3 days a week. Do not use any products that contain nicotine or tobacco. These products include cigarettes, chewing tobacco, and vaping devices, such as e-cigarettes. If you need help quitting, ask your health care provider. Monitor your blood pressure at home as told by your health care provider. Keep all follow-up visits. This is important. Medicines Take over-the-counter and prescription medicines only as told by your health care provider. Follow directions carefully. Blood  pressure medicines must be taken as prescribed. Do not skip doses of blood pressure medicine. Doing this puts you at risk for problems and can make the medicine less effective. Ask your health care provider about side effects or reactions to medicines that you should watch for. Contact a health care provider if you: Think you are having a reaction to a medicine you are taking. Have headaches that keep coming back (recurring). Feel dizzy. Have swelling in your ankles. Have trouble with your vision. Get help right away if you: Develop a severe headache or confusion. Have unusual weakness or numbness. Feel faint. Have severe pain in your chest or abdomen. Vomit repeatedly. Have trouble breathing. These symptoms may be an emergency. Get help right away. Call 911. Do not wait to see if the symptoms will go away. Do not drive yourself to the hospital. Summary Hypertension is when the force of blood pumping through your arteries is too strong. If this condition is not controlled, it may put you at risk for serious complications. Your personal target blood pressure may vary depending on your medical conditions, your age, and other factors. For most people, a normal blood pressure is less than 120/80. Hypertension is treated with lifestyle changes, medicines, or a combination of both. Lifestyle changes include losing weight, eating a healthy,   low-sodium diet, exercising more, and limiting alcohol. This information is not intended to replace advice given to you by your health care provider. Make sure you discuss any questions you have with your health care provider. Document Revised: 03/23/2021 Document Reviewed: 03/23/2021 [employer] Patient Education  2023 [employer]  

## 2022-04-27 DIAGNOSIS — Z713 Dietary counseling and surveillance: Secondary | ICD-10-CM | POA: Diagnosis not present

## 2022-06-15 LAB — HM DIABETES EYE EXAM

## 2023-01-09 DIAGNOSIS — Z6834 Body mass index (BMI) 34.0-34.9, adult: Secondary | ICD-10-CM | POA: Diagnosis not present

## 2023-01-09 DIAGNOSIS — Z1231 Encounter for screening mammogram for malignant neoplasm of breast: Secondary | ICD-10-CM | POA: Diagnosis not present

## 2023-01-09 DIAGNOSIS — Z01419 Encounter for gynecological examination (general) (routine) without abnormal findings: Secondary | ICD-10-CM | POA: Diagnosis not present

## 2023-01-09 LAB — HM MAMMOGRAPHY: HM Mammogram: NORMAL (ref 0–4)

## 2023-03-28 ENCOUNTER — Encounter: Payer: Self-pay | Admitting: Internal Medicine

## 2023-03-28 ENCOUNTER — Ambulatory Visit: Payer: BC Managed Care – PPO | Admitting: Internal Medicine

## 2023-03-28 ENCOUNTER — Telehealth: Payer: Self-pay | Admitting: Internal Medicine

## 2023-03-28 VITALS — BP 132/84 | HR 80 | Temp 98.5°F | Resp 16 | Ht 64.0 in | Wt 197.4 lb

## 2023-03-28 DIAGNOSIS — R030 Elevated blood-pressure reading, without diagnosis of hypertension: Secondary | ICD-10-CM

## 2023-03-28 DIAGNOSIS — E118 Type 2 diabetes mellitus with unspecified complications: Secondary | ICD-10-CM

## 2023-03-28 DIAGNOSIS — Z0000 Encounter for general adult medical examination without abnormal findings: Secondary | ICD-10-CM | POA: Diagnosis not present

## 2023-03-28 DIAGNOSIS — E785 Hyperlipidemia, unspecified: Secondary | ICD-10-CM | POA: Insufficient documentation

## 2023-03-28 LAB — URINALYSIS, ROUTINE W REFLEX MICROSCOPIC
Bilirubin Urine: NEGATIVE
Hgb urine dipstick: NEGATIVE
Ketones, ur: NEGATIVE
Leukocytes,Ua: NEGATIVE
Nitrite: NEGATIVE
Specific Gravity, Urine: 1.02 (ref 1.000–1.030)
Total Protein, Urine: NEGATIVE
Urine Glucose: NEGATIVE
Urobilinogen, UA: 0.2 (ref 0.0–1.0)
pH: 6 (ref 5.0–8.0)

## 2023-03-28 LAB — HEPATIC FUNCTION PANEL
ALT: 16 U/L (ref 0–35)
AST: 19 U/L (ref 0–37)
Albumin: 3.8 g/dL (ref 3.5–5.2)
Alkaline Phosphatase: 64 U/L (ref 39–117)
Bilirubin, Direct: 0.1 mg/dL (ref 0.0–0.3)
Total Bilirubin: 0.5 mg/dL (ref 0.2–1.2)
Total Protein: 7.1 g/dL (ref 6.0–8.3)

## 2023-03-28 LAB — CBC WITH DIFFERENTIAL/PLATELET
Basophils Absolute: 0 10*3/uL (ref 0.0–0.1)
Basophils Relative: 0.4 % (ref 0.0–3.0)
Eosinophils Absolute: 0.1 10*3/uL (ref 0.0–0.7)
Eosinophils Relative: 1.2 % (ref 0.0–5.0)
HCT: 42 % (ref 36.0–46.0)
Hemoglobin: 13.2 g/dL (ref 12.0–15.0)
Lymphocytes Relative: 28.6 % (ref 12.0–46.0)
Lymphs Abs: 2 10*3/uL (ref 0.7–4.0)
MCHC: 31.4 g/dL (ref 30.0–36.0)
MCV: 71.2 fL — ABNORMAL LOW (ref 78.0–100.0)
Monocytes Absolute: 0.6 10*3/uL (ref 0.1–1.0)
Monocytes Relative: 8.2 % (ref 3.0–12.0)
Neutro Abs: 4.4 10*3/uL (ref 1.4–7.7)
Neutrophils Relative %: 61.6 % (ref 43.0–77.0)
Platelets: 334 10*3/uL (ref 150.0–400.0)
RBC: 5.89 Mil/uL — ABNORMAL HIGH (ref 3.87–5.11)
RDW: 14.1 % (ref 11.5–15.5)
WBC: 7.2 10*3/uL (ref 4.0–10.5)

## 2023-03-28 LAB — LIPID PANEL
Cholesterol: 211 mg/dL — ABNORMAL HIGH (ref 0–200)
HDL: 84 mg/dL (ref >39.00)
LDL Cholesterol: 94 mg/dL (ref 0–99)
NonHDL: 127.22
Total CHOL/HDL Ratio: 3
Triglycerides: 164 mg/dL — ABNORMAL HIGH (ref 0.0–149.0)
VLDL: 32.8 mg/dL (ref 0.0–40.0)

## 2023-03-28 LAB — BASIC METABOLIC PANEL
BUN: 11 mg/dL (ref 6–23)
CO2: 23 meq/L (ref 19–32)
Calcium: 9.3 mg/dL (ref 8.4–10.5)
Chloride: 103 meq/L (ref 96–112)
Creatinine, Ser: 0.96 mg/dL (ref 0.40–1.20)
GFR: 69.97 mL/min (ref >60.00)
Glucose, Bld: 99 mg/dL (ref 70–99)
Potassium: 4.4 meq/L (ref 3.5–5.1)
Sodium: 134 meq/L — ABNORMAL LOW (ref 135–145)

## 2023-03-28 LAB — MICROALBUMIN / CREATININE URINE RATIO
Creatinine,U: 132.3 mg/dL
Microalb Creat Ratio: 0.5 mg/g (ref 0.0–30.0)
Microalb, Ur: <0.7 mg/dL (ref 0.0–1.9)

## 2023-03-28 LAB — TSH: TSH: 1.12 u[IU]/mL (ref 0.35–5.50)

## 2023-03-28 LAB — HEMOGLOBIN A1C: Hgb A1c MFr Bld: 6.5 % (ref 4.6–6.5)

## 2023-03-28 NOTE — Patient Instructions (Signed)

## 2023-03-28 NOTE — Progress Notes (Unsigned)
Subjective:  Patient ID: Stephanie Mendoza, female    DOB: April 06, 1976  Age: 47 y.o. MRN: [id number]  CC: Annual Exam, Hypertension, Hyperlipidemia, and Diabetes   HPI Stephanie Mendoza presents for a CPX and f/up ---  Discussed the use of AI scribe software for clinical note transcription with the patient, who gave verbal consent to proceed.  History of Present Illness   The patient, a non-smoker for nearly two years, reports feeling well overall with no new health issues. She has been seen by her regular gynecologist, Dr. Huntley Dec, for an annual physical in August of 2025, during which a mammogram and possibly a Pap smear were performed. The patient is still experiencing menstrual cycles, with the last one occurring on October 18th. She is currently using a Nuvaring for contraception, which has not caused any side effects.      She is active and denies DOE, CP, SOB, edema.  Outpatient Medications Prior to Visit  Medication Sig Dispense Refill   Cholecalciferol (VITAMIN D-3 PO) Take by mouth.     etonogestrel-ethinyl estradiol (NUVARING) 0.12-0.015 MG/24HR vaginal ring Insert vaginally and leave in place for 3 consecutive weeks, then remove for 1 week. 1 each 12   OVER THE COUNTER MEDICATION Probiotics with Prebiotics and cranberry     Prenatal Vit-Fe Fumarate-FA (PRENATAL VITAMIN PO) Take by mouth.     BIOTIN PO Take by mouth. gummies     Eszopiclone 3 MG TABS Take 1 tablet (3 mg total) by mouth at bedtime. Take immediately before bedtime 90 tablet 1   No facility-administered medications prior to visit.    ROS Review of Systems  Constitutional:  Negative for chills, diaphoresis and fatigue.  HENT: Negative.    Eyes: Negative.   Respiratory:  Negative for cough, chest tightness, shortness of breath and wheezing.   Cardiovascular:  Negative for chest pain, palpitations and leg swelling.  Gastrointestinal:  Negative for abdominal pain and nausea.  Endocrine: Negative.    Genitourinary: Negative.  Negative for difficulty urinating.  Musculoskeletal:  Negative for arthralgias, joint swelling and myalgias.  Skin: Negative.  Negative for color change and pallor.  Neurological: Negative.  Negative for dizziness and weakness.  Hematological:  Negative for adenopathy. Does not bruise/bleed easily.  Psychiatric/Behavioral: Negative.      Objective:  BP 132/84 (BP Location: Left Arm, Patient Position: Sitting, Cuff Size: Normal)   Pulse 80   Temp 98.5 F (36.9 C) (Oral)   Resp 16   Ht 5\' 4"  (1.626 m)   Wt 197 lb 6.4 oz (89.5 kg)   LMP 03/17/2023 (Approximate)   SpO2 99%   BMI 33.88 kg/m   BP Readings from Last 3 Encounters:  03/28/23 132/84  03/23/22 138/86  12/01/20 127/78    Wt Readings from Last 3 Encounters:  03/28/23 197 lb 6.4 oz (89.5 kg)  03/23/22 189 lb (85.7 kg)  12/01/20 170 lb (77.1 kg)    Physical Exam Vitals reviewed.  HENT:     Mouth/Throat:     Mouth: Mucous membranes are moist.  Eyes:     General: No scleral icterus.    Conjunctiva/sclera: Conjunctivae normal.  Cardiovascular:     Rate and Rhythm: Normal rate and regular rhythm.     Heart sounds: No murmur heard.    No gallop.  Pulmonary:     Effort: Pulmonary effort is normal.     Breath sounds: No stridor. No wheezing, rhonchi or rales.  Abdominal:     General: Abdomen is  flat.     Palpations: There is no mass.     Tenderness: There is no abdominal tenderness. There is no guarding.     Hernia: No hernia is present.  Musculoskeletal:        General: Normal range of motion.     Cervical back: Neck supple.     Right lower leg: No edema.     Left lower leg: No edema.  Lymphadenopathy:     Cervical: No cervical adenopathy.  Skin:    General: Skin is warm and dry.  Neurological:     General: No focal deficit present.     Mental Status: She is alert. Mental status is at baseline.  Psychiatric:        Mood and Affect: Mood normal.        Behavior: Behavior  normal.     Lab Results  Component Value Date   WBC 7.2 03/28/2023   HGB 13.2 03/28/2023   HCT 42.0 03/28/2023   PLT 334.0 03/28/2023   GLUCOSE 99 03/28/2023   CHOL 211 (H) 03/28/2023   TRIG 164.0 (H) 03/28/2023   HDL 84.00 03/28/2023   LDLDIRECT 88.6 08/12/2011   LDLCALC 94 03/28/2023   ALT 16 03/28/2023   AST 19 03/28/2023   NA 134 (L) 03/28/2023   K 4.4 03/28/2023   CL 103 03/28/2023   CREATININE 0.96 03/28/2023   BUN 11 03/28/2023   CO2 23 03/28/2023   TSH 1.12 03/28/2023   HGBA1C 6.5 03/28/2023   MICROALBUR <0.7 03/28/2023    MM DIAG BREAST TOMO BILATERAL  Result Date: 11/23/2021 CLINICAL DATA:  Patient returns today to evaluate possible bilateral breast asymmetries identified on a recent screening mammogram. EXAM: DIGITAL DIAGNOSTIC BILATERAL MAMMOGRAM WITH TOMOSYNTHESIS AND CAD; ULTRASOUND LEFT BREAST LIMITED; ULTRASOUND RIGHT BREAST LIMITED TECHNIQUE: Bilateral digital diagnostic mammography and breast tomosynthesis was performed. The images were evaluated with computer-aided detection.; Targeted ultrasound examination of the left breast was performed.; Targeted ultrasound examination of the right breast was performed COMPARISON:  Previous exams including recent screening mammogram dated 11/15/2021. ACR Breast Density Category c: The breast tissue is heterogeneously dense, which may obscure small masses. FINDINGS: On today's additional diagnostic views, partially obscured masses are confirmed within the upper RIGHT breast, 11-1 o'clock axis region, and lower LEFT breast, 5-6 o'clock axis region, both measuring approximately 7 mm greatest dimension. RIGHT breast: Targeted ultrasound is performed, showing a benign cyst in the RIGHT breast at the 11 o'clock axis, 1 cm from the nipple, measuring 6 mm, corresponding to the mammographic finding. Additional smaller cysts are seen within the upper-outer quadrant of the RIGHT breast, corresponding as incidental findings. LEFT breast:  Targeted ultrasound is performed, showing a benign cyst in the LEFT breast at the 6 o'clock axis, 2 cm from the nipple, measuring 6 mm, corresponding to the mammographic finding. An additional smaller cyst is seen within the adjacent LEFT breast at the 6 o'clock axis, corresponding as an incidental finding. IMPRESSION: No evidence of malignancy within either breast. Small benign cysts within each breast, largest measuring 6 mm, corresponding to the mammographic findings. Patient may return to routine annual bilateral screening mammogram schedule. RECOMMENDATION: Screening mammogram in one year.(Code:SM-B-01Y) I have discussed the findings and recommendations with the patient. If applicable, a reminder letter will be sent to the patient regarding the next appointment. BI-RADS CATEGORY  2: Benign. Electronically Signed   By: Bary Richard M.D.   On: 11/23/2021 14:17  US BREAST LTD UNI LEFT INC AXILLA  Result Date: 11/23/2021 CLINICAL DATA:  Patient returns today to evaluate possible bilateral breast asymmetries identified on a recent screening mammogram. EXAM: DIGITAL DIAGNOSTIC BILATERAL MAMMOGRAM WITH TOMOSYNTHESIS AND CAD; ULTRASOUND LEFT BREAST LIMITED; ULTRASOUND RIGHT BREAST LIMITED TECHNIQUE: Bilateral digital diagnostic mammography and breast tomosynthesis was performed. The images were evaluated with computer-aided detection.; Targeted ultrasound examination of the left breast was performed.; Targeted ultrasound examination of the right breast was performed COMPARISON:  Previous exams including recent screening mammogram dated 11/15/2021. ACR Breast Density Category c: The breast tissue is heterogeneously dense, which may obscure small masses. FINDINGS: On today's additional diagnostic views, partially obscured masses are confirmed within the upper RIGHT breast, 11-1 o'clock axis region, and lower LEFT breast, 5-6 o'clock axis region, both measuring approximately 7 mm greatest dimension. RIGHT breast:  Targeted ultrasound is performed, showing a benign cyst in the RIGHT breast at the 11 o'clock axis, 1 cm from the nipple, measuring 6 mm, corresponding to the mammographic finding. Additional smaller cysts are seen within the upper-outer quadrant of the RIGHT breast, corresponding as incidental findings. LEFT breast: Targeted ultrasound is performed, showing a benign cyst in the LEFT breast at the 6 o'clock axis, 2 cm from the nipple, measuring 6 mm, corresponding to the mammographic finding. An additional smaller cyst is seen within the adjacent LEFT breast at the 6 o'clock axis, corresponding as an incidental finding. IMPRESSION: No evidence of malignancy within either breast. Small benign cysts within each breast, largest measuring 6 mm, corresponding to the mammographic findings. Patient may return to routine annual bilateral screening mammogram schedule. RECOMMENDATION: Screening mammogram in one year.(Code:SM-B-01Y) I have discussed the findings and recommendations with the patient. If applicable, a reminder letter will be sent to the patient regarding the next appointment. BI-RADS CATEGORY  2: Benign. Electronically Signed   By: Bary Richard M.D.   On: 11/23/2021 14:17  US BREAST LTD UNI RIGHT INC AXILLA  Result Date: 11/23/2021 CLINICAL DATA:  Patient returns today to evaluate possible bilateral breast asymmetries identified on a recent screening mammogram. EXAM: DIGITAL DIAGNOSTIC BILATERAL MAMMOGRAM WITH TOMOSYNTHESIS AND CAD; ULTRASOUND LEFT BREAST LIMITED; ULTRASOUND RIGHT BREAST LIMITED TECHNIQUE: Bilateral digital diagnostic mammography and breast tomosynthesis was performed. The images were evaluated with computer-aided detection.; Targeted ultrasound examination of the left breast was performed.; Targeted ultrasound examination of the right breast was performed COMPARISON:  Previous exams including recent screening mammogram dated 11/15/2021. ACR Breast Density Category c: The breast tissue is  heterogeneously dense, which may obscure small masses. FINDINGS: On today's additional diagnostic views, partially obscured masses are confirmed within the upper RIGHT breast, 11-1 o'clock axis region, and lower LEFT breast, 5-6 o'clock axis region, both measuring approximately 7 mm greatest dimension. RIGHT breast: Targeted ultrasound is performed, showing a benign cyst in the RIGHT breast at the 11 o'clock axis, 1 cm from the nipple, measuring 6 mm, corresponding to the mammographic finding. Additional smaller cysts are seen within the upper-outer quadrant of the RIGHT breast, corresponding as incidental findings. LEFT breast: Targeted ultrasound is performed, showing a benign cyst in the LEFT breast at the 6 o'clock axis, 2 cm from the nipple, measuring 6 mm, corresponding to the mammographic finding. An additional smaller cyst is seen within the adjacent LEFT breast at the 6 o'clock axis, corresponding as an incidental finding. IMPRESSION: No evidence of malignancy within either breast. Small benign cysts within each breast, largest measuring 6 mm, corresponding to the mammographic findings. Patient may return to routine annual bilateral screening mammogram schedule. RECOMMENDATION:  Screening mammogram in one year.(Code:SM-B-01Y) I have discussed the findings and recommendations with the patient. If applicable, a reminder letter will be sent to the patient regarding the next appointment. BI-RADS CATEGORY  2: Benign. Electronically Signed   By: Bary Richard M.D.   On: 11/23/2021 14:17   Assessment & Plan:   Type II diabetes mellitus with manifestations (HCC)- Her blood sugar is well controlled. -     Urinalysis, Routine w reflex microscopic; Future -     Hemoglobin A1c; Future -     Microalbumin / creatinine urine ratio; Future -     Basic metabolic panel; Future -     CBC with Differential/Platelet; Future -     HM Diabetes Foot Exam  Routine general medical examination at a health care facility-  Exam completed, labs reviewed, vaccines reviewed, cancer screenings are UTD, pt ed material was given.  -     Lipid panel; Future  Dyslipidemia, goal LDL below 100- She has a low ASCVD risk score. -     Lipid panel; Future -     TSH; Future -     Hepatic function panel; Future  Pre-hypertension- She is working on her lifestyle modifications. -     Urinalysis, Routine w reflex microscopic; Future -     Basic metabolic panel; Future -     CBC with Differential/Platelet; Future     Follow-up: Return in about 6 months (around 09/26/2023).  Sanda Linger, MD

## 2023-03-28 NOTE — Telephone Encounter (Signed)
Spoke with patient, advised her that per dr Yetta Barre last lab work a year ago she did develop type 2 diabetes. Also advised her when he does her Physical and type up her visit he puts all diagnosis in the visit notes. She gave a verbal understanding.

## 2023-03-28 NOTE — Telephone Encounter (Signed)
Patient was seen by Dr. Yetta Barre today for a physical. She was looking at her AVS and it said that one of the issues addressed today was Type II diabetes. Patient is confused because she does not have diabetes and is not on any medications for it.  She would like a call back to discuss that at [phone].

## 2023-07-11 LAB — HM DIABETES EYE EXAM

## 2024-01-31 DIAGNOSIS — Z01419 Encounter for gynecological examination (general) (routine) without abnormal findings: Secondary | ICD-10-CM | POA: Diagnosis not present

## 2024-01-31 DIAGNOSIS — Z124 Encounter for screening for malignant neoplasm of cervix: Secondary | ICD-10-CM | POA: Diagnosis not present

## 2024-01-31 DIAGNOSIS — Z1231 Encounter for screening mammogram for malignant neoplasm of breast: Secondary | ICD-10-CM | POA: Diagnosis not present

## 2024-01-31 DIAGNOSIS — Z6833 Body mass index (BMI) 33.0-33.9, adult: Secondary | ICD-10-CM | POA: Diagnosis not present

## 2024-02-07 ENCOUNTER — Other Ambulatory Visit: Payer: Self-pay | Admitting: Obstetrics and Gynecology

## 2024-02-07 DIAGNOSIS — R928 Other abnormal and inconclusive findings on diagnostic imaging of breast: Secondary | ICD-10-CM

## 2024-02-09 ENCOUNTER — Ambulatory Visit
Admission: RE | Admit: 2024-02-09 | Discharge: 2024-02-09 | Disposition: A | Discharge status: Home / Self Care | Source: Ambulatory Visit | Attending: Obstetrics and Gynecology | Admitting: Obstetrics and Gynecology

## 2024-02-09 DIAGNOSIS — R921 Mammographic calcification found on diagnostic imaging of breast: Secondary | ICD-10-CM | POA: Diagnosis not present

## 2024-02-09 DIAGNOSIS — R928 Other abnormal and inconclusive findings on diagnostic imaging of breast: Secondary | ICD-10-CM

## 2024-02-13 ENCOUNTER — Encounter

## 2024-03-28 ENCOUNTER — Ambulatory Visit (INDEPENDENT_AMBULATORY_CARE_PROVIDER_SITE_OTHER): Admitting: Internal Medicine

## 2024-03-28 ENCOUNTER — Encounter: Payer: Self-pay | Admitting: Internal Medicine

## 2024-03-28 ENCOUNTER — Ambulatory Visit: Payer: Self-pay | Admitting: Internal Medicine

## 2024-03-28 VITALS — BP 128/88 | HR 69 | Temp 98.4°F | Resp 16 | Ht 64.0 in | Wt 192.4 lb

## 2024-03-28 DIAGNOSIS — Z87891 Personal history of nicotine dependence: Secondary | ICD-10-CM | POA: Insufficient documentation

## 2024-03-28 DIAGNOSIS — E118 Type 2 diabetes mellitus with unspecified complications: Secondary | ICD-10-CM | POA: Diagnosis not present

## 2024-03-28 DIAGNOSIS — Z0001 Encounter for general adult medical examination with abnormal findings: Secondary | ICD-10-CM | POA: Diagnosis not present

## 2024-03-28 DIAGNOSIS — E785 Hyperlipidemia, unspecified: Secondary | ICD-10-CM

## 2024-03-28 DIAGNOSIS — R030 Elevated blood-pressure reading, without diagnosis of hypertension: Secondary | ICD-10-CM

## 2024-03-28 LAB — HEPATIC FUNCTION PANEL
ALT: 15 U/L (ref 0–35)
AST: 18 U/L (ref 0–37)
Albumin: 3.9 g/dL (ref 3.5–5.2)
Alkaline Phosphatase: 64 U/L (ref 39–117)
Bilirubin, Direct: 0.2 mg/dL (ref 0.0–0.3)
Total Bilirubin: 0.5 mg/dL (ref 0.2–1.2)
Total Protein: 7.3 g/dL (ref 6.0–8.3)

## 2024-03-28 LAB — BASIC METABOLIC PANEL WITH GFR
BUN: 11 mg/dL (ref 6–23)
CO2: 24 meq/L (ref 19–32)
Calcium: 8.9 mg/dL (ref 8.4–10.5)
Chloride: 103 meq/L (ref 96–112)
Creatinine, Ser: 0.82 mg/dL (ref 0.40–1.20)
GFR: 83.95 mL/min (ref >60.00)
Glucose, Bld: 91 mg/dL (ref 70–99)
Potassium: 4.1 meq/L (ref 3.5–5.1)
Sodium: 134 meq/L — ABNORMAL LOW (ref 135–145)

## 2024-03-28 LAB — HEMOGLOBIN A1C: Hgb A1c MFr Bld: 6.3 % (ref 4.6–6.5)

## 2024-03-28 LAB — LIPID PANEL
Cholesterol: 213 mg/dL — ABNORMAL HIGH (ref 0–200)
HDL: 79.9 mg/dL (ref >39.00)
LDL Cholesterol: 98 mg/dL (ref 0–99)
NonHDL: 133.48
Total CHOL/HDL Ratio: 3
Triglycerides: 179 mg/dL — ABNORMAL HIGH (ref 0.0–149.0)
VLDL: 35.8 mg/dL (ref 0.0–40.0)

## 2024-03-28 LAB — CBC WITH DIFFERENTIAL/PLATELET
Basophils Absolute: 0 K/uL (ref 0.0–0.1)
Basophils Relative: 0.5 % (ref 0.0–3.0)
Eosinophils Absolute: 0 K/uL (ref 0.0–0.7)
Eosinophils Relative: 0.7 % (ref 0.0–5.0)
HCT: 41.5 % (ref 36.0–46.0)
Hemoglobin: 13.2 g/dL (ref 12.0–15.0)
Lymphocytes Relative: 25.1 % (ref 12.0–46.0)
Lymphs Abs: 1.8 K/uL (ref 0.7–4.0)
MCHC: 31.8 g/dL (ref 30.0–36.0)
MCV: 72.2 fl — ABNORMAL LOW (ref 78.0–100.0)
Monocytes Absolute: 0.5 K/uL (ref 0.1–1.0)
Monocytes Relative: 7 % (ref 3.0–12.0)
Neutro Abs: 4.7 K/uL (ref 1.4–7.7)
Neutrophils Relative %: 66.7 % (ref 43.0–77.0)
Platelets: 320 K/uL (ref 150.0–400.0)
RBC: 5.74 Mil/uL — ABNORMAL HIGH (ref 3.87–5.11)
RDW: 15.5 % (ref 11.5–15.5)
WBC: 7 K/uL (ref 4.0–10.5)

## 2024-03-28 LAB — TSH: TSH: 0.86 u[IU]/mL (ref 0.35–5.50)

## 2024-03-28 NOTE — Progress Notes (Signed)
 Subjective:  Patient ID: Stephanie Mendoza, female    DOB: 21-Jun-1975  Age: 48 y.o. MRN: [id number]  CC: Annual Exam, Hypertension, Diabetes, and Hyperlipidemia   HPI Katelynne S Bewick presents for a CPX and f/up ----  Discussed the use of AI scribe software for clinical note transcription with the patient, who gave verbal consent to proceed.  History of Present Illness Stephanie Mendoza is a 48 year old female who presents for an annual physical exam.  She uses NuvaRing for contraception and has regular menstrual cycles, with the last cycle on March 21, 2024. She takes Centrum for women and other vitamins and supplements. She does not take any medications for diabetes.  No excessive thirst, excessive urination, chest pain, shortness of breath, dizziness, lightheadedness, stomach issues, abdominal pain, nausea, vomiting, or diarrhea. She feels good and active, engaging in activities like hula hooping and jumping jacks without discomfort.  She had an eye exam at the beginning of the year and received a flu vaccine last week. She has not received a COVID vaccine since the initial rollout.  She is a nonsmoker and nondrinker, having quit smoking three years ago.     Outpatient Medications Prior to Visit  Medication Sig Dispense Refill   Cholecalciferol (VITAMIN D-3 PO) Take by mouth.     etonogestrel-ethinyl estradiol (NUVARING) 0.12-0.015 MG/24HR vaginal ring Insert vaginally and leave in place for 3 consecutive weeks, then remove for 1 week. 1 each 12   Multiple Vitamins-Minerals (CENTRUM WOMEN PO) Take by mouth.     Multiple Vitamins-Minerals (HAIR SKIN AND NAILS FORMULA PO) Take by mouth.     OVER THE COUNTER MEDICATION Probiotics with Prebiotics and cranberry     Prenatal Vit-Fe Fumarate-FA (PRENATAL VITAMIN PO) Take by mouth.     No facility-administered medications prior to visit.    ROS Review of Systems  Constitutional:  Negative for appetite change, chills, diaphoresis,  fatigue and fever.  HENT: Negative.    Eyes: Negative.  Negative for visual disturbance.  Respiratory: Negative.  Negative for cough, chest tightness, wheezing and stridor.   Cardiovascular:  Negative for chest pain, palpitations and leg swelling.  Gastrointestinal: Negative.  Negative for abdominal pain, blood in stool, constipation, diarrhea, nausea and vomiting.  Endocrine: Negative.   Genitourinary: Negative.  Negative for difficulty urinating.  Musculoskeletal: Negative.  Negative for arthralgias and myalgias.  Skin: Negative.   Neurological: Negative.  Negative for dizziness, weakness and light-headedness.  Hematological:  Negative for adenopathy. Does not bruise/bleed easily.  Psychiatric/Behavioral: Negative.      Objective:  BP 128/88 (BP Location: Left Arm, Patient Position: Sitting, Cuff Size: Normal)   Pulse 69   Temp 98.4 F (36.9 C) (Oral)   Resp 16   Ht 5' 4 (1.626 m)   Wt 192 lb 6.4 oz (87.3 kg)   LMP 03/21/2024   SpO2 99%   BMI 33.03 kg/m   BP Readings from Last 3 Encounters:  03/28/24 128/88  03/28/23 132/84  03/23/22 138/86    Wt Readings from Last 3 Encounters:  03/28/24 192 lb 6.4 oz (87.3 kg)  03/28/23 197 lb 6.4 oz (89.5 kg)  03/23/22 189 lb (85.7 kg)    Physical Exam Vitals reviewed.  Constitutional:      Appearance: Normal appearance.  HENT:     Nose: Nose normal.     Mouth/Throat:     Mouth: Mucous membranes are moist.  Eyes:     General: No scleral icterus.    Conjunctiva/sclera:  Conjunctivae normal.  Cardiovascular:     Rate and Rhythm: Normal rate and regular rhythm.     Heart sounds: No murmur heard.    No friction rub. No gallop.     Comments: EKG - SR with 1st degree AV block, 70 bpm No LVH, Q waves, or ST/T wave changes  Pulmonary:     Effort: Pulmonary effort is normal.     Breath sounds: No stridor. No wheezing, rhonchi or rales.  Abdominal:     General: Abdomen is flat.     Palpations: There is no mass.      Tenderness: There is no abdominal tenderness. There is no guarding.     Hernia: No hernia is present.  Musculoskeletal:     Cervical back: Neck supple.     Right lower leg: No edema.     Left lower leg: No edema.  Lymphadenopathy:     Cervical: No cervical adenopathy.  Skin:    General: Skin is warm and dry.     Findings: No rash.  Neurological:     General: No focal deficit present.     Mental Status: She is alert. Mental status is at baseline.  Psychiatric:        Mood and Affect: Mood normal.        Behavior: Behavior normal.     Lab Results  Component Value Date   WBC 7.0 03/28/2024   HGB 13.2 03/28/2024   HCT 41.5 03/28/2024   PLT 320.0 03/28/2024   GLUCOSE 91 03/28/2024   CHOL 213 (H) 03/28/2024   TRIG 179.0 (H) 03/28/2024   HDL 79.90 03/28/2024   LDLDIRECT 88.6 08/12/2011   LDLCALC 98 03/28/2024   ALT 15 03/28/2024   AST 18 03/28/2024   NA 134 (L) 03/28/2024   K 4.1 03/28/2024   CL 103 03/28/2024   CREATININE 0.82 03/28/2024   BUN 11 03/28/2024   CO2 24 03/28/2024   TSH 0.86 03/28/2024   HGBA1C 6.3 03/28/2024   MICROALBUR <0.7 03/28/2024    MM Digital Diagnostic Unilat L Result Date: 02/09/2024 CLINICAL DATA:  LEFT breast calcification callback. History of cysts EXAM: DIGITAL DIAGNOSTIC UNILATERAL LEFT MAMMOGRAM WITH CAD TECHNIQUE: Left digital diagnostic mammography was performed. COMPARISON:  Previous exam(s). ACR Breast Density Category c: The breasts are heterogeneously dense, which may obscure small masses. FINDINGS: Spot magnification views of the LEFT breast demonstrate scattered punctate calcifications throughout the LEFT breast without suspicious grouping or morphology. There are scattered benign layering calcifications noted, consistent with benign milk of calcium. IMPRESSION: No mammographic evidence of malignancy. RECOMMENDATION: Screening mammogram in one year.(Code:SM-B-01Y) Patient has dense breast tissue. Supplemental screening with breast  ultrasound (where available) or breast MRI with and without contrast/abbreviated breast MRI could be considered as clinically warranted. I have discussed the findings and recommendations with the patient. If applicable, a reminder letter will be sent to the patient regarding the next appointment. BI-RADS CATEGORY  2: Benign. Electronically Signed   By: Corean Salter M.D.   On: 02/09/2024 14:54    Assessment & Plan:   Dyslipidemia, goal LDL below 100- Statin is not indicated. -     Lipid panel; Future -     TSH; Future -     Hepatic function panel; Future  Prehypertension- Labs and EKG are reassuring. -     EKG 12-Lead -     CBC with Differential/Platelet; Future -     Basic metabolic panel with GFR; Future -     TSH;  Future -     Urinalysis, Routine w reflex microscopic; Future -     Hepatic function panel; Future  Type II diabetes mellitus with manifestations (HCC)- Her blood sugar is well controlled. -     HM Diabetes Foot Exam -     Basic metabolic panel with GFR; Future -     Urinalysis, Routine w reflex microscopic; Future -     Hemoglobin A1c; Future -     Microalbumin / creatinine urine ratio; Future -     Hepatitis B surface antibody,quantitative; Future  Former smoker -     Ambulatory Referral for Lung Cancer Scre  Encounter for general adult medical examination with abnormal findings- Exam completed, labs reviewed, vaccines reviewed, cancer screenings addressed, pt ed material was given.      Follow-up: Return in about 6 months (around 09/26/2024).  Debby Molt, MD

## 2024-03-28 NOTE — Patient Instructions (Signed)

## 2024-03-29 LAB — MICROALBUMIN / CREATININE URINE RATIO
Creatinine,U: 127.8 mg/dL
Microalb Creat Ratio: UNDETERMINED mg/g (ref 0.0–30.0)
Microalb, Ur: <0.7 mg/dL

## 2024-03-29 LAB — HEPATITIS B SURFACE ANTIBODY, QUANTITATIVE: Hep B S AB Quant (Post): <5 m[IU]/mL — ABNORMAL LOW (ref >=10)
